# Patient Record
Sex: Female | Born: 1990 | Race: White | Hispanic: No | Smoking: Former smoker
Health system: Southern US, Community
[De-identification: ages and names within clinical notes are randomized; demographics above are authoritative.]

## PROBLEM LIST (undated history)

## (undated) DIAGNOSIS — F32A Depression, unspecified: Secondary | ICD-10-CM

## (undated) DIAGNOSIS — R7303 Prediabetes: Secondary | ICD-10-CM

## (undated) DIAGNOSIS — H20829 Vogt-Koyanagi syndrome, unspecified eye: Secondary | ICD-10-CM

## (undated) DIAGNOSIS — F419 Anxiety disorder, unspecified: Secondary | ICD-10-CM

## (undated) DIAGNOSIS — B999 Unspecified infectious disease: Secondary | ICD-10-CM

## (undated) DIAGNOSIS — R87629 Unspecified abnormal cytological findings in specimens from vagina: Secondary | ICD-10-CM

## (undated) DIAGNOSIS — D649 Anemia, unspecified: Secondary | ICD-10-CM

## (undated) HISTORY — DX: Unspecified infectious disease: B99.9

## (undated) HISTORY — DX: Anxiety disorder, unspecified: F41.9

## (undated) HISTORY — DX: Anemia, unspecified: D64.9

## (undated) HISTORY — DX: Vogt-Koyanagi syndrome, unspecified eye: H20.829

## (undated) HISTORY — DX: Unspecified abnormal cytological findings in specimens from vagina: R87.629

---

## 2011-07-23 HISTORY — PX: LIPOMA EXCISION: SHX5283

## 2019-10-22 ENCOUNTER — Ambulatory Visit: Payer: BC Managed Care – PPO | Attending: Internal Medicine

## 2019-10-22 DIAGNOSIS — Z23 Encounter for immunization: Secondary | ICD-10-CM

## 2019-10-22 NOTE — Progress Notes (Signed)
   Covid-19 Vaccination Clinic  Name:  Eileen Dudley    MRN: [id number] DOB: 14-Aug-1990  10/22/2019  [name] was observed post Covid-19 immunization for 15 minutes without incident. She was provided with Vaccine Information Sheet and instruction to access the V-Safe system.   [name] was instructed to call 911 with any severe reactions post vaccine: Marland Kitchen Difficulty breathing  . Swelling of face and throat  . A fast heartbeat  . A bad rash all over body  . Dizziness and weakness   Immunizations Administered    Name Date Dose VIS Date Route   Pfizer COVID-19 Vaccine 10/22/2019  9:49 AM 0.3 mL 07/02/2019 Intramuscular   Manufacturer: [employer], [employer]   Lot: [id number]   NDC: [address]-8

## 2019-11-15 ENCOUNTER — Ambulatory Visit: Payer: BC Managed Care – PPO | Attending: Internal Medicine

## 2019-11-15 DIAGNOSIS — Z23 Encounter for immunization: Secondary | ICD-10-CM

## 2019-11-15 NOTE — Progress Notes (Signed)
   Covid-19 Vaccination Clinic  Name:  Chele Cornell    MRN: [id number] DOB: 28-Jan-1991  11/15/2019  Ms. Socarras was observed post Covid-19 immunization for 15 minutes without incident. She was provided with Vaccine Information Sheet and instruction to access the V-Safe system.   Ms. Andon was instructed to call 911 with any severe reactions post vaccine: Marland Kitchen Difficulty breathing  . Swelling of face and throat  . A fast heartbeat  . A bad rash all over body  . Dizziness and weakness   Immunizations Administered    Name Date Dose VIS Date Route   Pfizer COVID-19 Vaccine 11/15/2019 12:08 PM 0.3 mL 09/15/2018 Intramuscular   Manufacturer: [employer], [employer]   Lot: [id number]   NDC: [address]-9

## 2021-01-16 ENCOUNTER — Other Ambulatory Visit: Payer: Self-pay

## 2021-01-16 ENCOUNTER — Encounter: Payer: Self-pay | Admitting: Emergency Medicine

## 2021-01-16 ENCOUNTER — Ambulatory Visit
Admission: EM | Admit: 2021-01-16 | Discharge: 2021-01-16 | Disposition: A | Discharge status: Home / Self Care | Payer: BC Managed Care – PPO | Attending: Sports Medicine | Admitting: Sports Medicine

## 2021-01-16 DIAGNOSIS — N898 Other specified noninflammatory disorders of vagina: Secondary | ICD-10-CM | POA: Diagnosis not present

## 2021-01-16 DIAGNOSIS — B373 Candidiasis of vulva and vagina: Secondary | ICD-10-CM | POA: Diagnosis present

## 2021-01-16 DIAGNOSIS — Z711 Person with feared health complaint in whom no diagnosis is made: Secondary | ICD-10-CM | POA: Insufficient documentation

## 2021-01-16 DIAGNOSIS — B3731 Acute candidiasis of vulva and vagina: Secondary | ICD-10-CM

## 2021-01-16 DIAGNOSIS — Z113 Encounter for screening for infections with a predominantly sexual mode of transmission: Secondary | ICD-10-CM

## 2021-01-16 MED ORDER — FLUCONAZOLE 150 MG PO TABS: 150.0000 mg | ORAL_TABLET | Freq: Every day | ORAL | 0 refills | Status: DC

## 2021-01-16 NOTE — Discharge Instructions (Addendum)
As we discussed, I sent off a vaginal swab for yeast, bacterial vaginosis, chlamydia, and gonorrhea.  Someone will contact you if you need more than the Diflucan that I sent to your pharmacy to cover the yeast infection. Please see educational handouts. If symptoms persist please see your primary care provider. If symptoms worsen or you develop any fever then this could be a sign of an ascending infection which would require you to go to the ER.

## 2021-01-16 NOTE — ED Triage Notes (Signed)
Pt presents today with c/o of "I have a yeast infection". She reports vaginal itching, swelling with white discharge x 4 days.

## 2021-01-16 NOTE — ED Provider Notes (Signed)
[facility]    CSN: [id number] Arrival date & time: 01/16/21  1743      History   Chief Complaint Chief Complaint  Patient presents with   Vaginal Itching   Vaginal Discharge    HPI Eileen Dudley is a 30 y.o. female.   30 year old female who presents for evaluation of the above issue.  Her primary care physician is through [place].  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  She reports that she thinks she has a yeast infection.  She has had vaginal itching and a whitish discharge with a little bit of swelling of her labia for about 4 days now.  She is sexually active.  She recently had a sexual encounter with a new partner.  She is concerned that she may have an STD and wants to be tested for that as well.  She denies any dysuria or hematuria.  No increased urinary frequency or urgency.  No recent antibiotic use.  No real lower abdominal pain.  No flank pain.  No fever shakes chills.  No nausea vomiting or diarrhea.  No red flag signs or symptoms elicited on history.   History reviewed. No pertinent past medical history.  There are no problems to display for this patient.   History reviewed. No pertinent surgical history.  OB History   No obstetric history on file.      Home Medications    Prior to Admission medications   Medication Sig Start Date End Date Taking? Authorizing Provider  fluconazole (DIFLUCAN) 150 MG tablet Take 1 tablet (150 mg total) by mouth daily. Take 1 tablet by mouth today.  Can repeat in 1 week if still symptomatic. 01/16/21  Yes Delton See, MD    Family History Family History  Problem Relation Age of Onset   Pulmonary fibrosis Father     Social History Social History   Tobacco Use   Smoking status: Some Days    Pack years: 0.00    Types: Cigarettes   Smokeless tobacco: Never  Vaping Use   Vaping Use: Never used  Substance Use Topics   Alcohol use: Yes    Alcohol/week: 8.0 standard drinks    Types:  8 Cans of beer per week    Comment: weekly   Drug use: Never     Allergies   Patient has no known allergies.   Review of Systems Review of Systems  Constitutional:  Negative for activity change, appetite change, chills, diaphoresis, fatigue and fever.  HENT:  Negative for congestion, ear pain, postnasal drip, rhinorrhea, sinus pressure, sinus pain, sneezing and sore throat.   Eyes:  Negative for pain.  Respiratory:  Negative for cough, chest tightness and shortness of breath.   Cardiovascular:  Negative for chest pain and palpitations.  Gastrointestinal:  Negative for abdominal pain, diarrhea, nausea and vomiting.  Genitourinary:  Positive for vaginal discharge. Negative for dysuria, flank pain, frequency, genital sores, hematuria, urgency, vaginal bleeding and vaginal pain.       Positive for vaginal itching and swelling of the labia without pain.  Musculoskeletal:  Negative for back pain, myalgias and neck pain.  Skin:  Negative for color change, pallor, rash and wound.  Neurological:  Negative for dizziness, light-headedness and headaches.  All other systems reviewed and are negative.   Physical Exam Triage Vital Signs ED Triage Vitals  Enc Vitals Group     BP 01/16/21 1802 124/82     Pulse Rate 01/16/21 1802 86  Resp 01/16/21 1802 16     Temp 01/16/21 1802 98.2 F (36.8 C)     Temp Source 01/16/21 1802 Oral     SpO2 01/16/21 1802 99 %     Weight --      Height --      Head Circumference --      Peak Flow --      Pain Score 01/16/21 1756 3     Pain Loc --      Pain Edu? --      Excl. in GC? --    No data found.  Updated Vital Signs BP 124/82 (BP Location: Right Arm)   Pulse 86   Temp 98.2 F (36.8 C) (Oral)   Resp 16   LMP 01/03/2021 (Approximate)   SpO2 99%   Visual Acuity Right Eye Distance:   Left Eye Distance:   Bilateral Distance:    Right Eye Near:   Left Eye Near:    Bilateral Near:     Physical Exam Vitals and nursing note reviewed.   Constitutional:      General: She is not in acute distress.    Appearance: Normal appearance. She is not ill-appearing, toxic-appearing or diaphoretic.  HENT:     Head: Normocephalic and atraumatic.     Nose: Nose normal.     Mouth/Throat:     Mouth: Mucous membranes are moist.  Eyes:     General: No scleral icterus.    Conjunctiva/sclera: Conjunctivae normal.     Pupils: Pupils are equal, round, and reactive to light.  Cardiovascular:     Rate and Rhythm: Normal rate and regular rhythm.     Pulses: Normal pulses.     Heart sounds: Normal heart sounds. No murmur heard.   No friction rub. No gallop.  Pulmonary:     Effort: Pulmonary effort is normal.     Breath sounds: Normal breath sounds. No stridor. No wheezing, rhonchi or rales.  Abdominal:     General: Abdomen is flat.     Tenderness: There is no abdominal tenderness. There is no right CVA tenderness, left CVA tenderness, guarding or rebound.  Musculoskeletal:     Cervical back: Normal range of motion and neck supple.  Skin:    General: Skin is warm and dry.     Capillary Refill: Capillary refill takes less than 2 seconds.     Coloration: Skin is not jaundiced.     Findings: No erythema or rash.  Neurological:     General: No focal deficit present.     Mental Status: She is alert and oriented to person, place, and time.     UC Treatments / Results  Labs (all labs ordered are listed, but only abnormal results are displayed) Labs Reviewed  CERVICOVAGINAL ANCILLARY ONLY    EKG   Radiology No results found.  Procedures Procedures (including critical care time)  Medications Ordered in UC Medications - No data to display  Initial Impression / Assessment and Plan / UC Course  I have reviewed the triage vital signs and the nursing notes.  Pertinent labs & imaging results that were available during my care of the patient were reviewed by me and considered in my medical decision making (see chart for  details).  Clinical impression: Vaginal discharge with vaginal itching for about 4 days.  Concerning for potential yeast vaginitis versus another STD including GC, chlamydia, or bacterial vaginosis.  Treatment plan: 1.  The findings and treatment plan were discussed in detail  with the patient.  Patient was in agreement. 2.  Micah Flesher ahead and sent off a vaginal swab for yeast, BV, chlamydia and gonorrhea. 3.  Given her symptoms we will go ahead and treat her with Diflucan.  If she is in need of antibiotics for chlamydia or gonorrhea or metronidazole for BV someone will contact her based on her lab results. 4.  Encouraged her to download the app MyChart on her phone and follow along. 5.  Plenty of rest, plenty fluids, Tylenol or Motrin for any discomfort. 6.  Also gave her some educational handouts which included information on probiotics which I encouraged her to take. 7.  Encouraged her to seek out the care in the ER if she developed any flank pain or fever as this could be a sign of an a sending infection. 8.  If symptoms persist she should see her PCP. 9.  If symptoms were to worsen she should go to the ER. 10.  She was stable upon discharge and will follow-up here as needed.    Final Clinical Impressions(s) / UC Diagnoses   Final diagnoses:  Vaginal discharge  Yeast vaginitis  Concern about STD in female without diagnosis  Vaginal itching     Discharge Instructions      As we discussed, I sent off a vaginal swab for yeast, bacterial vaginosis, chlamydia, and gonorrhea.  Someone will contact you if you need more than the Diflucan that I sent to your pharmacy to cover the yeast infection. Please see educational handouts. If symptoms persist please see your primary care provider. If symptoms worsen or you develop any fever then this could be a sign of an ascending infection which would require you to go to the ER.     ED Prescriptions     Medication Sig Dispense Auth. Provider    fluconazole (DIFLUCAN) 150 MG tablet Take 1 tablet (150 mg total) by mouth daily. Take 1 tablet by mouth today.  Can repeat in 1 week if still symptomatic. 2 tablet Delton See, MD      PDMP not reviewed this encounter.   Delton See, MD 01/16/21 [phone]

## 2021-01-17 LAB — CERVICOVAGINAL ANCILLARY ONLY
Bacterial Vaginitis (gardnerella): POSITIVE — AB
Candida Glabrata: NEGATIVE
Candida Vaginitis: POSITIVE — AB
Chlamydia: NEGATIVE
Comment: NEGATIVE
Comment: NEGATIVE
Comment: NEGATIVE
Comment: NEGATIVE
Comment: NEGATIVE
Comment: NORMAL
Neisseria Gonorrhea: NEGATIVE
Trichomonas: NEGATIVE

## 2021-01-18 ENCOUNTER — Telehealth (HOSPITAL_COMMUNITY): Payer: Self-pay | Admitting: Emergency Medicine

## 2021-01-18 MED ORDER — METRONIDAZOLE 500 MG PO TABS: 500.0000 mg | ORAL_TABLET | Freq: Two times a day (BID) | ORAL | 0 refills | Status: DC

## 2021-01-30 ENCOUNTER — Inpatient Hospital Stay
Admission: EM | Admit: 2021-01-30 | Discharge: 2021-02-02 | DRG: 872 | Disposition: A | Discharge status: Home / Self Care | Payer: BC Managed Care – PPO | Source: Ambulatory Visit | Attending: Internal Medicine | Admitting: Internal Medicine

## 2021-01-30 ENCOUNTER — Encounter: Payer: Self-pay | Admitting: Emergency Medicine

## 2021-01-30 ENCOUNTER — Ambulatory Visit (INDEPENDENT_AMBULATORY_CARE_PROVIDER_SITE_OTHER)
Admission: EM | Admit: 2021-01-30 | Discharge: 2021-01-30 | Disposition: A | Discharge status: Home / Self Care | Payer: BC Managed Care – PPO | Source: Home / Self Care | Attending: Emergency Medicine | Admitting: Emergency Medicine

## 2021-01-30 ENCOUNTER — Emergency Department: Payer: BC Managed Care – PPO

## 2021-01-30 ENCOUNTER — Other Ambulatory Visit: Payer: Self-pay

## 2021-01-30 DIAGNOSIS — R6883 Chills (without fever): Secondary | ICD-10-CM | POA: Insufficient documentation

## 2021-01-30 DIAGNOSIS — M545 Low back pain, unspecified: Secondary | ICD-10-CM | POA: Insufficient documentation

## 2021-01-30 DIAGNOSIS — A419 Sepsis, unspecified organism: Secondary | ICD-10-CM | POA: Diagnosis not present

## 2021-01-30 DIAGNOSIS — N12 Tubulo-interstitial nephritis, not specified as acute or chronic: Secondary | ICD-10-CM | POA: Diagnosis present

## 2021-01-30 DIAGNOSIS — R000 Tachycardia, unspecified: Secondary | ICD-10-CM

## 2021-01-30 DIAGNOSIS — F17210 Nicotine dependence, cigarettes, uncomplicated: Secondary | ICD-10-CM | POA: Diagnosis present

## 2021-01-30 DIAGNOSIS — D689 Coagulation defect, unspecified: Secondary | ICD-10-CM | POA: Diagnosis present

## 2021-01-30 DIAGNOSIS — I959 Hypotension, unspecified: Secondary | ICD-10-CM | POA: Insufficient documentation

## 2021-01-30 DIAGNOSIS — E876 Hypokalemia: Secondary | ICD-10-CM | POA: Diagnosis present

## 2021-01-30 DIAGNOSIS — D649 Anemia, unspecified: Secondary | ICD-10-CM | POA: Diagnosis present

## 2021-01-30 DIAGNOSIS — R6520 Severe sepsis without septic shock: Secondary | ICD-10-CM | POA: Diagnosis not present

## 2021-01-30 DIAGNOSIS — N179 Acute kidney failure, unspecified: Secondary | ICD-10-CM | POA: Diagnosis present

## 2021-01-30 DIAGNOSIS — R3915 Urgency of urination: Secondary | ICD-10-CM | POA: Insufficient documentation

## 2021-01-30 DIAGNOSIS — Z3202 Encounter for pregnancy test, result negative: Secondary | ICD-10-CM | POA: Diagnosis present

## 2021-01-30 DIAGNOSIS — N200 Calculus of kidney: Secondary | ICD-10-CM | POA: Diagnosis present

## 2021-01-30 DIAGNOSIS — Z20822 Contact with and (suspected) exposure to covid-19: Secondary | ICD-10-CM | POA: Insufficient documentation

## 2021-01-30 DIAGNOSIS — R7303 Prediabetes: Secondary | ICD-10-CM | POA: Diagnosis present

## 2021-01-30 DIAGNOSIS — R079 Chest pain, unspecified: Secondary | ICD-10-CM | POA: Insufficient documentation

## 2021-01-30 DIAGNOSIS — F32A Depression, unspecified: Secondary | ICD-10-CM | POA: Diagnosis present

## 2021-01-30 DIAGNOSIS — R55 Syncope and collapse: Secondary | ICD-10-CM | POA: Insufficient documentation

## 2021-01-30 DIAGNOSIS — Z87440 Personal history of urinary (tract) infections: Secondary | ICD-10-CM

## 2021-01-30 HISTORY — DX: Tubulo-interstitial nephritis, not specified as acute or chronic: N12

## 2021-01-30 HISTORY — DX: Prediabetes: R73.03

## 2021-01-30 HISTORY — DX: Sepsis, unspecified organism: A41.9

## 2021-01-30 HISTORY — DX: Depression, unspecified: F32.A

## 2021-01-30 LAB — URINALYSIS, COMPLETE (UACMP) WITH MICROSCOPIC
Bilirubin Urine: NEGATIVE
Glucose, UA: NEGATIVE mg/dL
Ketones, ur: NEGATIVE mg/dL
Nitrite: NEGATIVE
Protein, ur: 30 mg/dL — AB
Specific Gravity, Urine: 1.01 (ref 1.005–1.030)
WBC, UA: >50 WBC/hpf — ABNORMAL HIGH (ref 0–5)
pH: 6 (ref 5.0–8.0)

## 2021-01-30 LAB — COMPREHENSIVE METABOLIC PANEL
ALT: 13 U/L (ref 0–44)
AST: 20 U/L (ref 15–41)
Albumin: 3 g/dL — ABNORMAL LOW (ref 3.5–5.0)
Alkaline Phosphatase: 57 U/L (ref 38–126)
Anion gap: 9 (ref 5–15)
BUN: 10 mg/dL (ref 6–20)
CO2: 25 mmol/L (ref 22–32)
Calcium: 7.8 mg/dL — ABNORMAL LOW (ref 8.9–10.3)
Chloride: 100 mmol/L (ref 98–111)
Creatinine, Ser: 1.21 mg/dL — ABNORMAL HIGH (ref 0.44–1.00)
GFR, Estimated: >60 mL/min (ref >60)
Glucose, Bld: 129 mg/dL — ABNORMAL HIGH (ref 70–99)
Potassium: 2.7 mmol/L — CL (ref 3.5–5.1)
Sodium: 134 mmol/L — ABNORMAL LOW (ref 135–145)
Total Bilirubin: 0.6 mg/dL (ref 0.3–1.2)
Total Protein: 7 g/dL (ref 6.5–8.1)

## 2021-01-30 LAB — CBC WITH DIFFERENTIAL/PLATELET
Abs Immature Granulocytes: 1.54 10*3/uL — ABNORMAL HIGH (ref 0.00–0.07)
Basophils Absolute: 0.1 10*3/uL (ref 0.0–0.1)
Basophils Relative: 0 %
Eosinophils Absolute: 0.1 10*3/uL (ref 0.0–0.5)
Eosinophils Relative: 0 %
HCT: 34.9 % — ABNORMAL LOW (ref 36.0–46.0)
Hemoglobin: 11.5 g/dL — ABNORMAL LOW (ref 12.0–15.0)
Immature Granulocytes: 5 %
Lymphocytes Relative: 3 %
Lymphs Abs: 0.8 10*3/uL (ref 0.7–4.0)
MCH: 28.7 pg (ref 26.0–34.0)
MCHC: 33 g/dL (ref 30.0–36.0)
MCV: 87 fL (ref 80.0–100.0)
Monocytes Absolute: 1.6 10*3/uL — ABNORMAL HIGH (ref 0.1–1.0)
Monocytes Relative: 5 %
Neutro Abs: 26.8 10*3/uL — ABNORMAL HIGH (ref 1.7–7.7)
Neutrophils Relative %: 87 %
Platelets: 463 10*3/uL — ABNORMAL HIGH (ref 150–400)
RBC: 4.01 MIL/uL (ref 3.87–5.11)
RDW: 15.9 % — ABNORMAL HIGH (ref 11.5–15.5)
Smear Review: NORMAL
WBC: 31 10*3/uL — ABNORMAL HIGH (ref 4.0–10.5)
nRBC: 0 % (ref 0.0–0.2)

## 2021-01-30 LAB — LACTIC ACID, PLASMA: Lactic Acid, Venous: 1.4 mmol/L (ref 0.5–1.9)

## 2021-01-30 LAB — RESP PANEL BY RT-PCR (FLU A&B, COVID) ARPGX2
Influenza A by PCR: NEGATIVE
Influenza B by PCR: NEGATIVE
SARS Coronavirus 2 by RT PCR: NEGATIVE

## 2021-01-30 LAB — MAGNESIUM: Magnesium: 1.6 mg/dL — ABNORMAL LOW (ref 1.7–2.4)

## 2021-01-30 LAB — APTT: aPTT: 35 seconds (ref 24–36)

## 2021-01-30 LAB — PROTIME-INR
INR: 1.3 — ABNORMAL HIGH (ref 0.8–1.2)
Prothrombin Time: 16.6 seconds — ABNORMAL HIGH (ref 11.4–15.2)

## 2021-01-30 MED ORDER — ONDANSETRON HCL 4 MG/2ML IJ SOLN: 4.0000 mg | Freq: Four times a day (QID) | INTRAMUSCULAR | Status: DC | PRN

## 2021-01-30 MED ORDER — POTASSIUM CHLORIDE CRYS ER 20 MEQ PO TBCR
40.0000 meq | EXTENDED_RELEASE_TABLET | Freq: Once | ORAL | Status: AC
  Administered 2021-01-30: 40 meq via ORAL
  Filled 2021-01-30: qty 2

## 2021-01-30 MED ORDER — SODIUM CHLORIDE 0.9 % IV SOLN
1.0000 g | INTRAVENOUS | Status: DC
  Administered 2021-01-31: 01:00:00 1 g via INTRAVENOUS
  Filled 2021-01-30 (×2): qty 10
  Filled 2021-01-30: qty 1

## 2021-01-30 MED ORDER — SODIUM CHLORIDE 0.9 % IV BOLUS
1000.0000 mL | Freq: Once | INTRAVENOUS | Status: AC
  Administered 2021-01-30: 1000 mL via INTRAVENOUS

## 2021-01-30 MED ORDER — POTASSIUM CHLORIDE 10 MEQ/100ML IV SOLN
10.0000 meq | INTRAVENOUS | Status: AC
  Administered 2021-01-30 (×3): 10 meq via INTRAVENOUS
  Filled 2021-01-30 (×3): qty 100

## 2021-01-30 MED ORDER — LACTATED RINGERS IV BOLUS (SEPSIS)
1000.0000 mL | Freq: Once | INTRAVENOUS | Status: AC
  Administered 2021-01-30: 1000 mL via INTRAVENOUS

## 2021-01-30 MED ORDER — ONDANSETRON HCL 4 MG/2ML IJ SOLN
4.0000 mg | Freq: Once | INTRAMUSCULAR | Status: DC
  Filled 2021-01-30: qty 2

## 2021-01-30 MED ORDER — ACETAMINOPHEN 325 MG PO TABS
650.0000 mg | ORAL_TABLET | Freq: Four times a day (QID) | ORAL | Status: DC | PRN
  Administered 2021-01-30 – 2021-02-02 (×4): 650 mg via ORAL
  Filled 2021-01-30 (×4): qty 2

## 2021-01-30 MED ORDER — ONDANSETRON HCL 4 MG PO TABS
4.0000 mg | ORAL_TABLET | Freq: Four times a day (QID) | ORAL | Status: DC | PRN
  Administered 2021-02-01: 4 mg via ORAL
  Filled 2021-01-30: qty 1

## 2021-01-30 MED ORDER — MAGNESIUM SULFATE 2 GM/50ML IV SOLN
2.0000 g | Freq: Once | INTRAVENOUS | Status: AC
  Administered 2021-01-30: 2 g via INTRAVENOUS
  Filled 2021-01-30: qty 50

## 2021-01-30 MED ORDER — METRONIDAZOLE 500 MG/100ML IV SOLN
500.0000 mg | Freq: Once | INTRAVENOUS | Status: AC
  Administered 2021-01-30: 500 mg via INTRAVENOUS
  Filled 2021-01-30: qty 100

## 2021-01-30 MED ORDER — ENOXAPARIN SODIUM 40 MG/0.4ML IJ SOSY
40.0000 mg | PREFILLED_SYRINGE | INTRAMUSCULAR | Status: DC
  Administered 2021-01-30 – 2021-02-01 (×3): 40 mg via SUBCUTANEOUS
  Filled 2021-01-30 (×3): qty 0.4

## 2021-01-30 MED ORDER — POTASSIUM CHLORIDE IN NACL 20-0.9 MEQ/L-% IV SOLN
INTRAVENOUS | Status: DC
  Filled 2021-01-30: qty 1000

## 2021-01-30 MED ORDER — ACETAMINOPHEN 650 MG RE SUPP: 650.0000 mg | Freq: Four times a day (QID) | RECTAL | Status: DC | PRN

## 2021-01-30 MED ORDER — VANCOMYCIN HCL IN DEXTROSE 1-5 GM/200ML-% IV SOLN
1000.0000 mg | Freq: Once | INTRAVENOUS | Status: AC
  Administered 2021-01-30: 1000 mg via INTRAVENOUS
  Filled 2021-01-30: qty 200

## 2021-01-30 MED ORDER — SODIUM CHLORIDE 0.9% FLUSH
3.0000 mL | Freq: Two times a day (BID) | INTRAVENOUS | Status: DC
  Administered 2021-01-30 – 2021-02-02 (×5): 3 mL via INTRAVENOUS

## 2021-01-30 MED ORDER — SODIUM CHLORIDE 0.9 % IV SOLN
2.0000 g | Freq: Once | INTRAVENOUS | Status: AC
  Administered 2021-01-30: 2 g via INTRAVENOUS
  Filled 2021-01-30: qty 2

## 2021-01-30 MED ORDER — LACTATED RINGERS IV BOLUS (SEPSIS)
1000.0000 mL | Freq: Once | INTRAVENOUS | Status: AC
Start: 2021-01-30 — End: 2021-01-30
  Administered 2021-01-30: 1000 mL via INTRAVENOUS

## 2021-01-30 MED ORDER — SODIUM CHLORIDE 0.9 % IV SOLN: Freq: Once | INTRAVENOUS | Status: AC

## 2021-01-30 MED ORDER — SODIUM CHLORIDE 0.9 % IV BOLUS: 1000.0000 mL | Freq: Once | INTRAVENOUS | Status: DC

## 2021-01-30 NOTE — Progress Notes (Signed)
PHARMACY -  BRIEF ANTIBIOTIC NOTE   Pharmacy has received consult(s) for vancomycin and cefepime from an ED provider.  The patient's profile has been reviewed for ht/wt/allergies/indication/available labs.    One time order(s) placed for vancomycin 1 g + cefepime 2 g  Further antibiotics/pharmacy consults should be ordered by admitting physician if indicated.                       Thank you,  Pricilla Riffle, PharmD 01/30/2021  4:46 PM

## 2021-01-30 NOTE — ED Provider Notes (Signed)
[facility] Emergency Department Provider Note  ____________________________________________   Event Date/Time   First MD Initiated Contact with Patient 01/30/21 1635     (approximate)  I have reviewed the triage vital signs and the nursing notes.   HISTORY  Chief Complaint Code Sepsis    HPI Eileen Dudley is a 30 y.o. female  here with severe chills, fever, back pain, urinary frequency. Seen on 6/28 last month at [facility] for BV/yeast infection, took flagyl and diflucan - vaginal sx cleared up, had mostly resolved. However, 2 days ago, started having sweats, chills, odor to urine, and urgency. Pt has since had loss of appetite, n/v, lightheadedness. Today, she went to UC and was found hypotensive, tachycardic, in distress. EMS called, started IV and brought here. Pt lied down at [facility] b/c she felt so dizzy and lightheaded. No current vaginal sx. No SOB, CP, cough. No known sick contacts. No flank pain. Reports some mild lower suprapubic and back pain. No alleviating factors.   History reviewed. No pertinent past medical history.  There are no problems to display for this patient.   History reviewed. No pertinent surgical history.  Prior to Admission medications   Medication Sig Start Date End Date Taking? Authorizing Provider  fluconazole (DIFLUCAN) 150 MG tablet Take 1 tablet (150 mg total) by mouth daily. Take 1 tablet by mouth today.  Can repeat in 1 week if still symptomatic. 01/16/21   Delton See, MD  metroNIDAZOLE (FLAGYL) 500 MG tablet Take 1 tablet (500 mg total) by mouth 2 (two) times daily. 01/18/21   Lamptey, Britta Mccreedy, MD    Allergies Patient has no known allergies.  Family History  Problem Relation Age of Onset   Pulmonary fibrosis Father     Social History Social History   Tobacco Use   Smoking status: Some Days    Pack years: 0.00    Types: Cigarettes   Smokeless tobacco: Never  Vaping Use   Vaping Use: Never used  Substance  Use Topics   Alcohol use: Yes    Alcohol/week: 8.0 standard drinks    Types: 8 Cans of beer per week    Comment: weekly   Drug use: Never    Review of Systems  Review of Systems  Constitutional:  Positive for chills, fatigue and fever.  HENT:  Negative for congestion and sore throat.   Eyes:  Negative for visual disturbance.  Respiratory:  Negative for cough and shortness of breath.   Cardiovascular:  Negative for chest pain.  Gastrointestinal:  Positive for nausea and vomiting. Negative for abdominal pain and diarrhea.  Genitourinary:  Positive for dysuria and frequency. Negative for flank pain.  Musculoskeletal:  Negative for back pain and neck pain.  Skin:  Negative for rash and wound.  Neurological:  Positive for weakness.  All other systems reviewed and are negative.   ____________________________________________  PHYSICAL EXAM:      VITAL SIGNS: ED Triage Vitals  Enc Vitals Group     BP 01/30/21 1643 (!) 78/55     Pulse Rate 01/30/21 1643 (!) 124     Resp 01/30/21 1643 18     Temp 01/30/21 1643 98.7 F (37.1 C)     Temp Source 01/30/21 1643 Oral     SpO2 01/30/21 1643 100 %     Weight 01/30/21 1641 164 lb 12.8 oz (74.8 kg)     Height 01/30/21 1641 5\' 6"  (1.676 m)     Head Circumference --  Peak Flow --      Pain Score --      Pain Loc --      Pain Edu? --      Excl. in GC? --      Physical Exam Vitals and nursing note reviewed.  Constitutional:      General: She is not in acute distress.    Appearance: She is well-developed.  HENT:     Head: Normocephalic and atraumatic.     Mouth/Throat:     Mouth: Mucous membranes are dry.  Eyes:     Conjunctiva/sclera: Conjunctivae normal.  Cardiovascular:     Rate and Rhythm: Regular rhythm. Tachycardia present.     Heart sounds: Normal heart sounds. No murmur heard.   No friction rub.  Pulmonary:     Effort: Pulmonary effort is normal. No respiratory distress.     Breath sounds: Normal breath sounds. No  wheezing or rales.  Abdominal:     General: There is no distension.     Palpations: Abdomen is soft.     Tenderness: There is no abdominal tenderness (mild, suprapubic, no rebound or guarding).  Musculoskeletal:     Cervical back: Neck supple.  Skin:    General: Skin is warm.     Capillary Refill: Capillary refill takes less than 2 seconds.  Neurological:     Mental Status: She is alert and oriented to person, place, and time.     Motor: No abnormal muscle tone.      ____________________________________________   LABS (all labs ordered are listed, but only abnormal results are displayed)  Labs Reviewed  COMPREHENSIVE METABOLIC PANEL - Abnormal; Notable for the following components:      Result Value   Sodium 134 (*)    Potassium 2.7 (*)    Glucose, Bld 129 (*)    Creatinine, Ser 1.21 (*)    Calcium 7.8 (*)    Albumin 3.0 (*)    All other components within normal limits  CBC WITH DIFFERENTIAL/PLATELET - Abnormal; Notable for the following components:   WBC 31.0 (*)    Hemoglobin 11.5 (*)    HCT 34.9 (*)    RDW 15.9 (*)    Platelets 463 (*)    Neutro Abs 26.8 (*)    Monocytes Absolute 1.6 (*)    Abs Immature Granulocytes 1.54 (*)    All other components within normal limits  PROTIME-INR - Abnormal; Notable for the following components:   Prothrombin Time 16.6 (*)    INR 1.3 (*)    All other components within normal limits  CULTURE, BLOOD (ROUTINE X 2)  CULTURE, BLOOD (ROUTINE X 2)  URINE CULTURE  LACTIC ACID, PLASMA  APTT  URINALYSIS, COMPLETE (UACMP) WITH MICROSCOPIC  POC URINE PREG, ED    ____________________________________________  EKG: Sinus tachycardia, VR 122. PR 144, QRS 92, QTc 433. Borderline TWA. No acute st elevations or depressions. No ischemia or infarct. ________________________________________  RADIOLOGY All imaging, including plain films, CT scans, and ultrasounds, independently reviewed by me, and interpretations confirmed via formal  radiology reads.  ED MD interpretation:   CT stone: Edematous left kidney with adjacent inflammatory stranding suggestive of pyelonephritis, no obstruction Chest x-ray: Clear  Official radiology report(s): DG Chest Port 1 View  Result Date: 01/30/2021 CLINICAL DATA:  Questionable sepsis EXAM: PORTABLE CHEST 1 VIEW COMPARISON:  None. FINDINGS: The heart size and mediastinal contours are within normal limits. Both lungs are clear. The visualized skeletal structures are unremarkable. IMPRESSION: No active disease.  Electronically Signed   By: Maudry Mayhew MD   On: 01/30/2021 17:50   CT Renal Stone Study  Result Date: 01/30/2021 CLINICAL DATA:  History of UTI completed antibiotics, now with back pain, fever chills and flu like symptoms. EXAM: CT ABDOMEN AND PELVIS WITHOUT CONTRAST TECHNIQUE: Multidetector CT imaging of the abdomen and pelvis was performed following the standard protocol without IV contrast. COMPARISON:  None. FINDINGS: Lower chest: No acute abnormality. Hepatobiliary: No focal liver abnormality is seen. No gallstones, gallbladder wall thickening, or biliary dilatation. Pancreas: Unremarkable. No pancreatic ductal dilatation or surrounding inflammatory changes. Spleen: Normal in size without focal abnormality. Adrenals/Urinary Tract: Bilateral adrenal glands are unremarkable. No hydronephrosis. Punctate nonobstructive 1-2 mm right lower pole renal stone. Edematous appearance of the left kidney with adjacent inflammatory stranding. No obstructive ureteral or bladder calculi visualized. Urinary bladder is unremarkable for degree of distension. Stomach/Bowel: Stomach is within normal limits. Appendix is not definitely visualized but there is no pericecal inflammation. No evidence of bowel wall thickening, distention, or inflammatory changes. Vascular/Lymphatic: No significant vascular findings are present. No enlarged abdominal or pelvic lymph nodes. Reproductive: Uterus and bilateral adnexa  are unremarkable. Other: No pneumoperitoneum or ascites. Musculoskeletal: No acute or significant osseous findings. IMPRESSION: 1. Edematous appearance of the left kidney with adjacent inflammatory stranding, suggestive of pyelonephritis. No hydronephrosis. 2. No obstructive ureteral or bladder calculi visualized. 3. Punctate nonobstructive right renal stone. Electronically Signed   By: Maudry Mayhew MD   On: 01/30/2021 17:59    ____________________________________________  PROCEDURES   Procedure(s) performed (including Critical Care):  Procedures  ____________________________________________  INITIAL IMPRESSION / MDM / ASSESSMENT AND PLAN / ED COURSE  As part of my medical decision making, I reviewed the following data within the electronic MEDICAL RECORD NUMBER Nursing notes reviewed and incorporated, Old chart reviewed, Notes from prior ED visits, and [place] Controlled Substance Database       *Shanikka Wonders was evaluated in Emergency Department on 01/30/2021 for the symptoms described in the history of present illness. She was evaluated in the context of the global COVID-19 pandemic, which necessitated consideration that the patient might be at risk for infection with the SARS-CoV-2 virus that causes COVID-19. Institutional protocols and algorithms that pertain to the evaluation of patients at risk for COVID-19 are in a state of rapid change based on information released by regulatory bodies including the CDC and federal and state organizations. These policies and algorithms were followed during the patient's care in the ED.  Some ED evaluations and interventions may be delayed as a result of limited staffing during the pandemic.*     Medical Decision Making: 30 year old female here with urinary frequency, dysuria, fever, nausea, and chills.  On arrival, patient is tachycardic, hypotensive, and tachypneic.  Vital signs are concerning for severe sepsis, likely secondary to UTI with  pyelonephritis.  Patient started on broad-spectrum empiric antibiotics with 30 cc/kg fluid bolus per protocol.  Lab work shows marked leukocytosis with left shift with white count of 31,000.  CMP shows likely dehydration with creatinine of 1.2 and hypokalemia to 2.7.  This is been replaced.  Lactic acid is normal.  CT stone study obtained given the degree of her sepsis with more unremarkable exam, and is consistent with [place].  No evidence of stone.  Chest x-ray reviewed and is clear.  EKG shows sinus tachycardia.  Will plan to admit for sepsis due to pyelonephritis.  Heart rate, blood pressure is improving significantly with fluids and patient nontoxic, mentating  well on exam.  ____________________________________________  FINAL CLINICAL IMPRESSION(S) / ED DIAGNOSES  Final diagnoses:  Sepsis without acute organ dysfunction, due to unspecified organism [facility])  Pyelonephritis     MEDICATIONS GIVEN DURING THIS VISIT:  Medications  lactated ringers bolus 1,000 mL (1,000 mLs Intravenous New Bag/Given 01/30/21 1652)    And  lactated ringers bolus 1,000 mL (has no administration in time range)    And  lactated ringers bolus 1,000 mL (has no administration in time range)  metroNIDAZOLE (FLAGYL) IVPB 500 mg (500 mg Intravenous New Bag/Given 01/30/21 1749)  vancomycin (VANCOCIN) IVPB 1000 mg/200 mL premix (1,000 mg Intravenous New Bag/Given 01/30/21 1746)  potassium chloride SA (KLOR-CON) CR tablet 40 mEq (has no administration in time range)  potassium chloride 10 mEq in 100 mL IVPB (has no administration in time range)  ondansetron (ZOFRAN) injection 4 mg (has no administration in time range)  ceFEPIme (MAXIPIME) 2 g in sodium chloride 0.9 % 100 mL IVPB (0 g Intravenous Stopped 01/30/21 1740)     ED Discharge Orders     None        Note:  This document was prepared using Dragon voice recognition software and may include unintentional dictation errors.   Shaune Pollack, MD 01/30/21  [phone]

## 2021-01-30 NOTE — Plan of Care (Signed)

## 2021-01-30 NOTE — ED Provider Notes (Signed)
HPI  SUBJECTIVE:  Eileen Dudley is a 30 y.o. female who presents with body aches, feeling faint, low back pain for the past 2 days.  She reports urinary urgency, frequency, cloudy and odorous urine.  She had an episode of chest pain earlier that she cannot describe, but it has resolved.  She states that she has been having body aches for the past few weeks.  She was seen here on 6/24 with vaginal irritation and itching, having body aches at that time as well.  She was found to have BV and yeast, was treated with Flagyl and Diflucan.  She finished these 2 days ago and the body aches returned.  Low back pain and urinary symptoms are new.  She reports sweats, chills, feeling hot, and does not have a thermometer at home.  No headache, nasal congestion, rhinorrhea, sore throat, loss of sense of smell or taste, nausea, vomiting, diarrhea, abdominal pain, pelvic pain, vaginal bleeding.  No coughing, wheezing, shortness of breath.  No dysuria, hematuria.  No known exposure to COVID or flu.  She got the second dose of the COVID-vaccine.  Did not get the flu vaccine.  No recent tick bite.  No rash.  She has been sleeping and trying to push fluids without improvement in her symptoms.  No aggravating or alleviating factors.  She has a past medical history of frequent UTIs, no history of pyelonephritis.  No history of COVID, diabetes, hypertension.  LMP: 1 and half weeks ago.  She is not sure if she could be pregnant.  PMD: In [place].   History reviewed. No pertinent past medical history.  History reviewed. No pertinent surgical history.  Family History  Problem Relation Age of Onset   Pulmonary fibrosis Father     Social History   Tobacco Use   Smoking status: Some Days    Pack years: 0.00    Types: Cigarettes   Smokeless tobacco: Never  Vaping Use   Vaping Use: Never used  Substance Use Topics   Alcohol use: Yes    Alcohol/week: 8.0 standard drinks    Types: 8 Cans of beer per week    Comment:  weekly   Drug use: Never    No current facility-administered medications for this encounter.  Current Outpatient Medications:    fluconazole (DIFLUCAN) 150 MG tablet, Take 1 tablet (150 mg total) by mouth daily. Take 1 tablet by mouth today.  Can repeat in 1 week if still symptomatic., Disp: 2 tablet, Rfl: 0   metroNIDAZOLE (FLAGYL) 500 MG tablet, Take 1 tablet (500 mg total) by mouth 2 (two) times daily., Disp: 14 tablet, Rfl: 0  No Known Allergies   ROS  As noted in HPI.   Physical Exam  BP (!) 88/66   Pulse (!) 120   Temp 99.3 F (37.4 C) (Oral)   Resp 18   Ht 5\' 6"  (1.676 m)   Wt 81.6 kg   LMP 01/03/2021 (Approximate)   SpO2 100%   BMI 29.05 kg/m   Vitals with BMI 01/30/2021 01/30/2021 01/30/2021  Height - - 5\' 6"   Weight - - 180 lbs  BMI - - 29.07  Systolic 88 68 -  Diastolic 66 57 -  Pulse 120 133 -     Constitutional: Well developed, well nourished, appears ill.  Pale. Eyes:  EOMI, conjunctiva normal bilaterally HENT: Normocephalic, atraumatic,mucus membranes moist Respiratory: Normal inspiratory effort, lungs clear bilaterally Cardiovascular: Regular tachycardia, no murmurs rubs or gallops GI: nondistended soft, nontender, no guarding,  rebound. Back: Bilateral CVAT skin: No rash, skin intact Musculoskeletal: no deformities Neurologic: Alert & oriented x 3, no focal neuro deficits Psychiatric: Speech and behavior appropriate   ED Course   Medications  sodium chloride 0.9 % bolus 1,000 mL (1,000 mLs Intravenous New Bag/Given 01/30/21 1530)    Orders Placed This Encounter  Procedures   Resp Panel by RT-PCR (Flu A&B, Covid) Nasopharyngeal Swab    Standing Status:   Standing    Number of Occurrences:   1    Order Specific Question:   Is this test for diagnosis or screening    Answer:   Diagnosis of ill patient    Order Specific Question:   Symptomatic for COVID-19 as defined by CDC    Answer:   Yes    Order Specific Question:   Date of Symptom Onset     Answer:   01/28/2021    Order Specific Question:   Hospitalized for COVID-19    Answer:   No    Order Specific Question:   Admitted to ICU for COVID-19    Answer:   No    Order Specific Question:   Previously tested for COVID-19    Answer:   Yes    Order Specific Question:   Resident in a congregate (group) care setting    Answer:   No    Order Specific Question:   Employed in healthcare setting    Answer:   No    Order Specific Question:   Pregnant    Answer:   No    Order Specific Question:   Has patient completed COVID vaccination(s) (2 doses of Pfizer/Moderna 1 dose of [employer])    Answer:   Yes    Order Specific Question:   Has patient completed COVID Booster / 3rd dose    Answer:   No   Airborne and Contact precautions    Standing Status:   Standing    Number of Occurrences:   1    No results found for this or any previous visit (from the past 24 hour(s)). No results found.  ED Clinical Impression  1. Hypotension, unspecified hypotension type   2. Tachycardia      ED Assessment/Plan  EKG: Sinus tachycardia, rate 121.  Normal axis, normal intervals.  Atrial hypertrophy.  Mild ST depression in inferior leads.  No ST elevation.   Patient was seen immediately upon arrival.  She has an acute illness with systemic symptoms most notably tachycardia at 133, hypotension. Initial blood pressure 66/57.   concern for urosepsis.  In the differential is sepsis from PID, but think that this is less likely as she denies any pelvic pain.  COVID, flu in the differential but think this is less likely with urinary symptoms.  Normal bolus saline was started.  EMS called.  Transferring to the [facility] emergency department.  Gave report to fire department and EMS.  Gave report to ED charge nurse.  Meds ordered this encounter  Medications   sodium chloride 0.9 % bolus 1,000 mL     *This clinic note was created using Scientist, clinical (histocompatibility and immunogenetics). Therefore, there may be occasional  mistakes despite careful proofreading.  ?    Domenick Gong, MD 01/30/21 1554

## 2021-01-30 NOTE — ED Notes (Signed)
Report received from Ashley, RN 

## 2021-01-30 NOTE — ED Notes (Signed)
Transported to ED via EMS 

## 2021-01-30 NOTE — Progress Notes (Signed)
CODE SEPSIS - PHARMACY COMMUNICATION  **Broad Spectrum Antibiotics should be administered within 1 hour of Sepsis diagnosis**  Time Code Sepsis Called/Page Received: 1645  Antibiotics Ordered: cefepime/Flagyl/vancomycin  Time of 1st antibiotic administration: 1655    Pricilla Riffle ,PharmD Clinical Pharmacist  01/30/2021  5:22 PM

## 2021-01-30 NOTE — ED Triage Notes (Signed)
Pt via EMS from [facility]. Pt was seen on the 28th at [facility] and was seen and treated for BV. Pt finished her antibiotics and started having back pain, chills, fever, and flu-like symptoms. Pt initial BP 60/40 and HR in 130s. Pt is A&Ox4 and NAD

## 2021-01-30 NOTE — ED Notes (Signed)
Admitting provider at bedside.

## 2021-01-30 NOTE — Progress Notes (Signed)
   01/30/21 2210  Assess: MEWS Score  Temp 99.3 F (37.4 C)  BP 102/65  Pulse Rate (!) 124  ECG Heart Rate (!) 124  Resp 20  Level of Consciousness Alert  SpO2 100 %  Assess: MEWS Score  MEWS Temp 0  MEWS Systolic 0  MEWS Pulse 2  MEWS RR 0  MEWS LOC 0  MEWS Score 2  MEWS Score Color Yellow  Assess: if the MEWS score is Yellow or Red  Were vital signs taken at a resting state? Yes  Focused Assessment No change from prior assessment  Does the patient meet 2 or more of the SIRS criteria? Yes  Does the patient have a confirmed or suspected source of infection? Yes  Provider and Rapid Response Notified? Yes (Patient MEWS 3 in ED)  MEWS guidelines implemented *See Row Information* Yes  Treat  MEWS Interventions Administered prn meds/treatments;Administered scheduled meds/treatments (tylenol given ED)  Pain Scale 0-10  Pain Score 0  Take Vital Signs  Increase Vital Sign Frequency  Yellow: Q 2hr X 2 then Q 4hr X 2, if remains yellow, continue Q 4hrs  Escalate  MEWS: Escalate Yellow: discuss with charge nurse/RN and consider discussing with provider and RRT  Notify: Charge Nurse/RN  Name of Charge Nurse/RN Notified Annice Pih  Date Charge Nurse/RN Notified 01/30/21  Time Charge Nurse/RN Notified 2230  Assess: SIRS CRITERIA  SIRS Temperature  0  SIRS Pulse 1  SIRS Respirations  0  SIRS WBC 1  SIRS Score Sum  2

## 2021-01-30 NOTE — ED Notes (Signed)
Ashley RN aware of assigned bed 

## 2021-01-30 NOTE — ED Notes (Signed)
Unable to get urine sample at this time.

## 2021-01-30 NOTE — ED Triage Notes (Signed)
Patient states that she has been having body aches all over her body, states that she feels like she has the flu.

## 2021-01-30 NOTE — ED Notes (Signed)
Pt's IV pump alarming as LR bolus finished. Channel turned off.

## 2021-01-30 NOTE — ED Notes (Signed)
Patient is being discharged from the Urgent Care and sent to the Emergency Department via EMS . Per Dr. Chaney Malling, patient is in need of higher level of care due to possible sepsis. Patient is aware and verbalizes understanding of plan of care.  Vitals:   01/30/21 1535 01/30/21 1549  BP: (!) 88/66 (!) 88/68  Pulse: (!) 120 (!) 120  Resp: 18   Temp:    SpO2: 100%

## 2021-01-30 NOTE — H&P (Signed)
History and Physical    Eileen Dudley XBM:[id number] DOB: [birth year] DOA: 01/30/2021  PCP: Patient, No Pcp Per (Inactive)  Patient coming from: Home  I have personally briefly reviewed patient's old medical records in [facility]  Chief Complaint: Back pain, fevers, chills  HPI: Eileen Dudley is a 30 y.o. female with medical history significant for prediabetes, depression, recurrent UTI who presented to the ED for evaluation of back pain, fevers, chills.  Patient initially had symptoms of fevers, chills, and concern for vaginal yeast infection about 2 weeks ago.  On June 28 she was diagnosed and treated for bacterial vaginitis and Candida vaginitis with a course of Flagyl and Diflucan.  She said her symptoms resolved after starting Flagyl.  She says she completed her course of Flagyl about 2 days ago and since then has been having recurrent fevers, chills, diaphoresis, nausea without emesis.  She has been having intermittent left flank discomfort.  She felt as if she had the flu and went to the urgent care for further evaluation.  In urgent care she was noted to be hypotensive with BP 68/57 and tachycardic with heart rate 133.  She was sent to the ED for further evaluation due to concern for sepsis.  ED Course:  Initial vitals showed BP 70/55, MAP 63, pulse 129, RR 26, temp 98.7 F, SPO2 93% on room air.  Labs show WBC 31.0, hemoglobin 11.5, platelets 463,000, sodium 134, potassium 2.7, bicarb 25, BUN 10, creatinine 1.21, serum glucose 129, LFTs within normal limits, lactic acid 1.4.  SARS-CoV-2 PCR negative.  Influenza a and B PCR's are negative.  Blood cultures obtained and pending.  Urinalysis and urine culture ordered and pending collection.  Portable chest x-ray negative for focal consolidation, edema, or effusion.  CT renal stone study shows findings suggestive of left-sided pyelonephritis without hydronephrosis.  No obstructive ureteral or bladder calculi visualized.   Punctate nonobstructive right renal stone noted.  Patient was given 3 L LR, IV K 10 mEq x 3, oral K 40 mEq x1, IV vancomycin, cefepime, and Flagyl.  The hospitalist service was consulted to admit for further evaluation and management.  Review of Systems: All systems reviewed and are negative except as documented in history of present illness above.   Past Medical History:  Diagnosis Date   Depression    Prediabetes     History reviewed. No pertinent surgical history.  Social History:  reports that she has been smoking cigarettes. She has never used smokeless tobacco. She reports current alcohol use of about 8.0 standard drinks of alcohol per week. She reports that she does not use drugs.  No Known Allergies  Family History  Problem Relation Age of Onset   Pulmonary fibrosis Father      Prior to Admission medications   Medication Sig Start Date End Date Taking? Authorizing Provider  fluconazole (DIFLUCAN) 150 MG tablet Take 1 tablet (150 mg total) by mouth daily. Take 1 tablet by mouth today.  Can repeat in 1 week if still symptomatic. 01/16/21   Delton See, MD  metroNIDAZOLE (FLAGYL) 500 MG tablet Take 1 tablet (500 mg total) by mouth 2 (two) times daily. 01/18/21   Merrilee Jansky, MD    Physical Exam: Vitals:   01/30/21 1700 01/30/21 1745 01/30/21 1800 01/30/21 1830  BP: (!) 91/58 103/71 93/68 97/76   Pulse:   (!) 101 (!) 111  Resp: 11 11 (!) 23 19  Temp:      TempSrc:  SpO2:   98% 100%  Weight:      Height:       Constitutional: Resting supine in bed, NAD, calm, comfortable Eyes: PERRL, lids and conjunctivae normal ENMT: Mucous membranes are moist. Posterior pharynx clear of any exudate or lesions.Normal dentition.  Neck: normal, supple, no masses. Respiratory: clear to auscultation bilaterally, no wheezing, no crackles. Normal respiratory effort. No accessory muscle use.  Cardiovascular: Regular rate and rhythm, no murmurs / rubs / gallops. No extremity  edema. 2+ pedal pulses. Abdomen: Positive left CVA tenderness.  No abdominal tenderness, no masses palpated. No hepatosplenomegaly. Bowel sounds positive.  Musculoskeletal: no clubbing / cyanosis. No joint deformity upper and lower extremities. Good ROM, no contractures. Normal muscle tone.  Skin: no rashes, lesions, ulcers. No induration Neurologic: CN 2-12 grossly intact. Sensation intact. Strength 5/5 in all 4.  Psychiatric: Normal judgment and insight. Alert and oriented x 3. Normal mood.   Labs on Admission: I have personally reviewed following labs and imaging studies  CBC: Recent Labs  Lab 01/30/21 1637  WBC 31.0*  NEUTROABS 26.8*  HGB 11.5*  HCT 34.9*  MCV 87.0  PLT 463*   Basic Metabolic Panel: Recent Labs  Lab 01/30/21 1637  NA 134*  K 2.7*  CL 100  CO2 25  GLUCOSE 129*  BUN 10  CREATININE 1.21*  CALCIUM 7.8*  MG 1.6*   GFR: Estimated Creatinine Clearance: 69 mL/min (A) (by C-G formula based on SCr of 1.21 mg/dL (H)). Liver Function Tests: Recent Labs  Lab 01/30/21 1637  AST 20  ALT 13  ALKPHOS 57  BILITOT 0.6  PROT 7.0  ALBUMIN 3.0*   No results for input(s): LIPASE, AMYLASE in the last 168 hours. No results for input(s): AMMONIA in the last 168 hours. Coagulation Profile: Recent Labs  Lab 01/30/21 1637  INR 1.3*   Cardiac Enzymes: No results for input(s): CKTOTAL, CKMB, CKMBINDEX, TROPONINI in the last 168 hours. BNP (last 3 results) No results for input(s): PROBNP in the last 8760 hours. HbA1C: No results for input(s): HGBA1C in the last 72 hours. CBG: No results for input(s): GLUCAP in the last 168 hours. Lipid Profile: No results for input(s): CHOL, HDL, LDLCALC, TRIG, CHOLHDL, LDLDIRECT in the last 72 hours. Thyroid Function Tests: No results for input(s): TSH, T4TOTAL, FREET4, T3FREE, THYROIDAB in the last 72 hours. Anemia Panel: No results for input(s): VITAMINB12, FOLATE, FERRITIN, TIBC, IRON, RETICCTPCT in the last 72  hours. Urine analysis: No results found for: COLORURINE, APPEARANCEUR, LABSPEC, PHURINE, GLUCOSEU, HGBUR, BILIRUBINUR, KETONESUR, PROTEINUR, UROBILINOGEN, NITRITE, LEUKOCYTESUR  Radiological Exams on Admission: DG Chest Port 1 View  Result Date: 01/30/2021 CLINICAL DATA:  Questionable sepsis EXAM: PORTABLE CHEST 1 VIEW COMPARISON:  None. FINDINGS: The heart size and mediastinal contours are within normal limits. Both lungs are clear. The visualized skeletal structures are unremarkable. IMPRESSION: No active disease. Electronically Signed   By: Maudry Mayhew MD   On: 01/30/2021 17:50   CT Renal Stone Study  Result Date: 01/30/2021 CLINICAL DATA:  History of UTI completed antibiotics, now with back pain, fever chills and flu like symptoms. EXAM: CT ABDOMEN AND PELVIS WITHOUT CONTRAST TECHNIQUE: Multidetector CT imaging of the abdomen and pelvis was performed following the standard protocol without IV contrast. COMPARISON:  None. FINDINGS: Lower chest: No acute abnormality. Hepatobiliary: No focal liver abnormality is seen. No gallstones, gallbladder wall thickening, or biliary dilatation. Pancreas: Unremarkable. No pancreatic ductal dilatation or surrounding inflammatory changes. Spleen: Normal in size without focal abnormality. Adrenals/Urinary  Tract: Bilateral adrenal glands are unremarkable. No hydronephrosis. Punctate nonobstructive 1-2 mm right lower pole renal stone. Edematous appearance of the left kidney with adjacent inflammatory stranding. No obstructive ureteral or bladder calculi visualized. Urinary bladder is unremarkable for degree of distension. Stomach/Bowel: Stomach is within normal limits. Appendix is not definitely visualized but there is no pericecal inflammation. No evidence of bowel wall thickening, distention, or inflammatory changes. Vascular/Lymphatic: No significant vascular findings are present. No enlarged abdominal or pelvic lymph nodes. Reproductive: Uterus and bilateral  adnexa are unremarkable. Other: No pneumoperitoneum or ascites. Musculoskeletal: No acute or significant osseous findings. IMPRESSION: 1. Edematous appearance of the left kidney with adjacent inflammatory stranding, suggestive of pyelonephritis. No hydronephrosis. 2. No obstructive ureteral or bladder calculi visualized. 3. Punctate nonobstructive right renal stone. Electronically Signed   By: Maudry Mayhew MD   On: 01/30/2021 17:59    EKG: Personally reviewed. Sinus tachycardia, rate 122.  No prior for comparison.  Assessment/Plan Principal Problem:   Severe sepsis (HCC) Active Problems:   Pyelonephritis of left kidney   Hypokalemia   Eileen Dudley is a 30 y.o. female with medical history significant for prediabetes, depression, recurrent UTI who is admitted with severe sepsis due to left-sided pyelonephritis.  Severe sepsis due to left-sided pyelonephritis: Patient presenting with hypotension, MAP <65, tachycardia, tachypnea, and findings consistent with left-sided pyelonephritis. -Narrow antibiotics IV ceftriaxone -Awaiting urinalysis/urine culture collection -Follow blood cultures -Continue IV fluid hydration overnight  Hypokalemia: Receiving oral and IV supplementation.  Magnesium 1.6, will supplement.   DVT prophylaxis: Lovenox Code Status: Full code Family Communication: Discussed with patient, she has discussed with family Disposition Plan: From home and likely discharge to home pending clinical progress Consults called: None Level of care: Med-Surg Admission status:  Status is: Observation  The patient remains OBS appropriate and will d/c before 2 midnights.  Dispo: The patient is from: Home              Anticipated d/c is to: Home              Patient currently is not medically stable to d/c.   Difficult to place patient No   Darreld Mclean MD [facility]  If 7PM-7AM, please contact night-coverage [email or web address]  01/30/2021, 7:32 PM

## 2021-01-30 NOTE — Sepsis Progress Note (Signed)
Sepsis protocol followed by eLink 

## 2021-01-31 DIAGNOSIS — Z3202 Encounter for pregnancy test, result negative: Secondary | ICD-10-CM | POA: Diagnosis present

## 2021-01-31 DIAGNOSIS — E876 Hypokalemia: Secondary | ICD-10-CM | POA: Diagnosis present

## 2021-01-31 DIAGNOSIS — N179 Acute kidney failure, unspecified: Secondary | ICD-10-CM | POA: Diagnosis present

## 2021-01-31 DIAGNOSIS — N200 Calculus of kidney: Secondary | ICD-10-CM | POA: Diagnosis present

## 2021-01-31 DIAGNOSIS — D649 Anemia, unspecified: Secondary | ICD-10-CM | POA: Diagnosis present

## 2021-01-31 DIAGNOSIS — A419 Sepsis, unspecified organism: Principal | ICD-10-CM | POA: Diagnosis present

## 2021-01-31 DIAGNOSIS — R6520 Severe sepsis without septic shock: Secondary | ICD-10-CM | POA: Diagnosis present

## 2021-01-31 DIAGNOSIS — F32A Depression, unspecified: Secondary | ICD-10-CM | POA: Diagnosis present

## 2021-01-31 DIAGNOSIS — Z87440 Personal history of urinary (tract) infections: Secondary | ICD-10-CM | POA: Diagnosis not present

## 2021-01-31 DIAGNOSIS — R7303 Prediabetes: Secondary | ICD-10-CM | POA: Diagnosis present

## 2021-01-31 DIAGNOSIS — F17210 Nicotine dependence, cigarettes, uncomplicated: Secondary | ICD-10-CM | POA: Diagnosis present

## 2021-01-31 DIAGNOSIS — D689 Coagulation defect, unspecified: Secondary | ICD-10-CM | POA: Diagnosis present

## 2021-01-31 DIAGNOSIS — N12 Tubulo-interstitial nephritis, not specified as acute or chronic: Secondary | ICD-10-CM | POA: Diagnosis present

## 2021-01-31 DIAGNOSIS — Z20822 Contact with and (suspected) exposure to covid-19: Secondary | ICD-10-CM | POA: Diagnosis present

## 2021-01-31 LAB — CBC
HCT: 30 % — ABNORMAL LOW (ref 36.0–46.0)
Hemoglobin: 9.7 g/dL — ABNORMAL LOW (ref 12.0–15.0)
MCH: 28.4 pg (ref 26.0–34.0)
MCHC: 32.3 g/dL (ref 30.0–36.0)
MCV: 88 fL (ref 80.0–100.0)
Platelets: 338 10*3/uL (ref 150–400)
RBC: 3.41 MIL/uL — ABNORMAL LOW (ref 3.87–5.11)
RDW: 16.2 % — ABNORMAL HIGH (ref 11.5–15.5)
WBC: 22.8 10*3/uL — ABNORMAL HIGH (ref 4.0–10.5)
nRBC: 0 % (ref 0.0–0.2)

## 2021-01-31 LAB — HIV ANTIBODY (ROUTINE TESTING W REFLEX): HIV Screen 4th Generation wRfx: NONREACTIVE

## 2021-01-31 LAB — PREGNANCY, URINE: Preg Test, Ur: NEGATIVE

## 2021-01-31 LAB — LACTIC ACID, PLASMA
Lactic Acid, Venous: 0.9 mmol/L (ref 0.5–1.9)
Lactic Acid, Venous: 1.9 mmol/L (ref 0.5–1.9)

## 2021-01-31 LAB — BASIC METABOLIC PANEL
Anion gap: 6 (ref 5–15)
BUN: 8 mg/dL (ref 6–20)
CO2: 25 mmol/L (ref 22–32)
Calcium: 7.5 mg/dL — ABNORMAL LOW (ref 8.9–10.3)
Chloride: 106 mmol/L (ref 98–111)
Creatinine, Ser: 0.9 mg/dL (ref 0.44–1.00)
GFR, Estimated: >60 mL/min (ref >60)
Glucose, Bld: 124 mg/dL — ABNORMAL HIGH (ref 70–99)
Potassium: 4.4 mmol/L (ref 3.5–5.1)
Sodium: 137 mmol/L (ref 135–145)

## 2021-01-31 LAB — PROCALCITONIN: Procalcitonin: 4.75 ng/mL

## 2021-01-31 MED ORDER — LACTATED RINGERS IV SOLN: INTRAVENOUS | Status: DC

## 2021-01-31 MED ORDER — SODIUM CHLORIDE 0.9 % IV BOLUS: 500.0000 mL | Freq: Once | INTRAVENOUS | Status: DC

## 2021-01-31 MED ORDER — HYDROXYZINE HCL 25 MG PO TABS
25.0000 mg | ORAL_TABLET | Freq: Three times a day (TID) | ORAL | Status: DC | PRN
  Administered 2021-01-31: 22:00:00 25 mg via ORAL
  Filled 2021-01-31 (×2): qty 1

## 2021-01-31 MED ORDER — KETOROLAC TROMETHAMINE 15 MG/ML IJ SOLN
15.0000 mg | Freq: Four times a day (QID) | INTRAMUSCULAR | Status: DC | PRN
  Filled 2021-01-31: qty 1

## 2021-01-31 MED ORDER — SODIUM CHLORIDE 0.9 % IV BOLUS
1000.0000 mL | Freq: Once | INTRAVENOUS | Status: AC
  Administered 2021-01-31: 05:00:00 1000 mL via INTRAVENOUS

## 2021-01-31 MED ORDER — KETOROLAC TROMETHAMINE 30 MG/ML IJ SOLN
30.0000 mg | Freq: Once | INTRAMUSCULAR | Status: AC
  Administered 2021-01-31: 15:00:00 30 mg via INTRAVENOUS
  Filled 2021-01-31: qty 1

## 2021-01-31 MED ORDER — SODIUM CHLORIDE 0.9 % IV BOLUS
500.0000 mL | Freq: Once | INTRAVENOUS | Status: AC
  Administered 2021-01-31: 500 mL via INTRAVENOUS

## 2021-01-31 MED ORDER — SODIUM CHLORIDE 0.9 % IV SOLN
2.0000 g | INTRAVENOUS | Status: DC
  Administered 2021-01-31 – 2021-02-01 (×2): 2 g via INTRAVENOUS
  Filled 2021-01-31: qty 20
  Filled 2021-01-31 (×2): qty 2

## 2021-01-31 MED ORDER — SODIUM CHLORIDE 0.9 % IV BOLUS
1000.0000 mL | Freq: Once | INTRAVENOUS | Status: AC
  Administered 2021-01-31: 1000 mL via INTRAVENOUS

## 2021-01-31 NOTE — Progress Notes (Signed)
   01/31/21 1156  Assess: MEWS Score  Temp (!) 100.5 F (38.1 C)  BP (!) 100/58  Pulse Rate (!) 106  Resp 20  SpO2 99 %  O2 Device Room Air  Yellow MEWS, flagged. Messaged MD per request of patient for tylenol. Notified of the MEWS. Discussed with charge nurse Debi on MEWS protocol. Will monitor patient.

## 2021-01-31 NOTE — Progress Notes (Signed)
Dr. Para March made aware of bp and pt dizziness with ambulating to bathroom. No new orders received.   01/31/21 0212  Vitals  Temp 98.1 F (36.7 C)  Temp Source Oral  BP (!) 84/59  MAP (mmHg) 68  BP Location Left Arm  BP Method Automatic  Patient Position (if appropriate) Lying  Pulse Rate 79  Resp 16  MEWS COLOR  MEWS Score Color Green  Oxygen Therapy  SpO2 100 %  O2 Device Room Air  MEWS Score  MEWS Temp 0  MEWS Systolic 1  MEWS Pulse 0  MEWS RR 0  MEWS LOC 0  MEWS Score 1

## 2021-01-31 NOTE — Significant Event (Signed)
Rapid Response Event Note   Reason for Call :  Red MEWS, SBP<90, HR elevated 120s-130s ST, febrile  Initial Focused Assessment:  Rapid response RN arrived in patient's room with patient's RN preparing IV fluid bolus. Patient lying in bed, flushed and with heat that can be easily felt when hand placed near her skin. Patient admitted yesterday evening with pyelonephritis and Code Sepsis called yesterday in the ED 7/12 at 16:41. Blood cultures and urine cultures with no growth yet. Patient already received tyelnol around midday and nurse had toradol to give after bolus started. Patient's nurse had already touched base with MD to get orders for bolus and toradol. BP at 14:15 87/52 MAP 64, HR upon RRT arrival 133 ST, respirations 20 unlabored, oxygen saturations 96% on room air.  Interventions:  Helped monitor patient while bolus infusing. Rechecked vital signs (minus temperature) at 14:53 which showed BP 96/56, MAP 69, HR 112 ST, RR 18 unlabored, oxygen saturations 98% on room air. Patient educated on infection treatments, sepsis, and current plan of care.   Plan of Care:  Patient with Code Sepsis already called in last 24 hours with cultures obtained then. Spoke with Pam RN at [facility] and Dr. Allena Katz, no plan to recall code sepsis at this time, but MD ordered serial lactic acid checks. Plan to for 1C staff recheck vital signs after bolus and based on vital signs MD will decide if more fluid needed.   Event Summary:   MD Notified: Dr. Allena Katz Call Time: 14:26 Arrival Time: 14:28 End Time: 15:10  Bennie Dallas, RN

## 2021-01-31 NOTE — Progress Notes (Signed)
   01/31/21 1415  Assess: MEWS Score  BP (!) 87/52  Pulse Rate (!) 124  Resp 18  SpO2 97 %  O2 Device Room Air  Assess: MEWS Score  MEWS Temp 1  MEWS Systolic 1  MEWS Pulse 2  MEWS RR 0  MEWS LOC 0  MEWS Score 4  MEWS Score Color Red  Assess: if the MEWS score is Yellow or Red  Were vital signs taken at a resting state? Yes  Focused Assessment Change from prior assessment (see assessment flowsheet)  Does the patient meet 2 or more of the SIRS criteria? Yes  Does the patient have a confirmed or suspected source of infection? Yes  Provider and Rapid Response Notified? Yes  MEWS guidelines implemented *See Row Information* Yes  Treat  MEWS Interventions Escalated (See documentation below) (notified Chrasarge nurse, MD called RRT)  Pain Scale 0-10  Pain Score 0  Take Vital Signs  Increase Vital Sign Frequency  Red: Q 1hr X 4 then Q 4hr X 4, if remains red, continue Q 4hrs  Escalate  MEWS: Escalate Red: discuss with charge nurse/RN and provider, consider discussing with RRT  Notify: Charge Nurse/RN  Name of Charge Nurse/RN Notified Debi, Rn  Date Charge Nurse/RN Notified 01/31/21  Time Charge Nurse/RN Notified 1415  Notify: Provider  Provider Name/Title Patel  Date Provider Notified 01/31/21  Time Provider Notified 1415  Notification Type  (secure message)  Notification Reason Change in status  Provider response See new orders  Date of Provider Response  (See orders)  Time of Provider Response 1424  Notify: Rapid Response  Name of Rapid Response RN Notified Sarah, RN  Date Rapid Response Notified 01/31/21  Time Rapid Response Notified 1415  Document  Patient Outcome Stabilized after interventions  Progress note created (see row info) Yes  Assess: SIRS CRITERIA  SIRS Temperature  0  SIRS Pulse 1  SIRS Respirations  0  SIRS WBC 1  SIRS Score Sum  2

## 2021-01-31 NOTE — Progress Notes (Signed)
Assumed care of patient.

## 2021-01-31 NOTE — Progress Notes (Signed)
   01/31/21 1415  Assess: MEWS Score  BP (!) 87/52  Pulse Rate (!) 124  Resp 18  SpO2 97 %  O2 Device Room Air  Patient MEWS RED.Marland Kitchen Charge nurse notified. MD notified, RRT called. 1L bolus given, IV Toradol given. Will monitor

## 2021-01-31 NOTE — Progress Notes (Signed)
Patient pending shower. IV maintenance disconnected.  Patient awake, alert. No distress. Recently febrile, currently resolving fever diaphoresis.

## 2021-01-31 NOTE — Progress Notes (Addendum)
   01/31/21 0444 01/31/21 0448  Vitals  Temp (!) 97.3 F (36.3 C)  --   Temp Source Oral  --   BP (!) 77/61 91/62 (put pt in [place])  MAP (mmHg) 67 72  BP Location Left Arm Left Arm  BP Method Automatic Automatic  Patient Position (if appropriate) Lying Lying  Pulse Rate 87  --   Resp 18  --   MEWS COLOR  MEWS Score Color Yellow Green  Oxygen Therapy  SpO2 100 %  --   Dr. Para March messaged regarding hypotension at rest and the pt is diaphoretic. Pt placed in trendelenburg and bp improved. MD msg regarding improved bp due to trended pt. NS bolus ordered and infusing 1000 ml bolus at this time.

## 2021-01-31 NOTE — Progress Notes (Signed)
[facility] Progress Note  Patient: Eileen Dudley    MBE:[id number]  DOA: 01/30/2021     Date of Service: the patient was seen and examined on 01/31/2021  Brief hospital course: Past medical history of depression, recurrent UTI.  Presents with complaints of left flank pain with fever and chills.  Found to have pyonephritis of left kidney.  Currently receiving IV antibiotics  Subjective: Continues to report mild left flank pain.  Also has dizziness and fatigue and tiredness.  Also reports chills but no fever.  No nausea no vomiting.  No diarrhea.  No bleeding. Last period was 2 weeks ago.  Assessment and Plan: 1.  Severe sepsis, present on admission secondary to left-sided pyelonephritis Met SIRS criteria on admission with hypotension, tachycardia and tachypnea. Received IV hydration. Persistently remains hypotensive.  We will provide IV hydration again. Initially started on IV ceftriaxone.  We will increase the dose of 2 g daily. Urine cultures blood cultures currently pending. CT negative for any stone or obstruction or abscess so far. Monitor.  2.  Hypokalemia Hypomagnesemia Currently corrected. Monitor.  3.  Anemia Likely chronic in nature. Acute component most likely secondary to dilution rather than acute bleeding. Monitor.  4.  We will check urine pregnancy test for the patient.  5.  Coagulopathy INR mildly elevated in the setting of sepsis.  Monitor.  6.  Acute kidney injury, present on admission. Serum creatinine on admission 1.21. Currently corrected to 0.90 after hydration. Continue with IV fluids.  Scheduled Meds:  enoxaparin (LOVENOX) injection  40 mg Subcutaneous Q24H   sodium chloride flush  3 mL Intravenous Q12H   Continuous Infusions:  cefTRIAXone (ROCEPHIN)  IV     lactated ringers 125 mL/hr at 01/31/21 0947   PRN Meds: acetaminophen **OR** acetaminophen, ondansetron **OR** ondansetron (ZOFRAN) IV  Body mass index is 26.6 kg/m.         DVT Prophylaxis:   enoxaparin (LOVENOX) injection 40 mg Start: 01/30/21 2200    Advance goals of care discussion: Pt is Full code.  Family Communication: no family was present at bedside, at the time of interview.   Data Reviewed: I have personally reviewed and interpreted daily labs, tele strips, imaging. WBC improving.  Serum creatinine improving.  Potassium corrected.  Physical Exam:  General: Appear in mild distress, no Rash; Oral Mucosa Clear, moist. no Abnormal Neck Mass Or lumps, Conjunctiva normal  Cardiovascular: S1 and S2 Present, no Murmur, Respiratory: good respiratory effort, Bilateral Air entry present and CTA, no Crackles, no wheezes Abdomen: Bowel Sound present, Soft and left flank tenderness Extremities: no Pedal edema Neurology: alert and oriented to time, place, and person affect appropriate. no new focal deficit Gait not checked due to patient safety concerns  Vitals:   01/31/21 0523 01/31/21 0559 01/31/21 0648 01/31/21 0834  BP: (!) 85/62 90/64 90/70  96/70  Pulse: 81  95 99  Resp:    18  Temp:    98.5 F (36.9 C)  TempSrc:    Oral  SpO2:    98%  Weight:      Height:        Disposition:  Status is: Observation   Dispo: The patient is from: Home              Anticipated d/c is to: Home              Patient currently is not medically stable to d/c.   Difficult to place patient No  Time spent: 35 minutes. I reviewed all nursing notes, pharmacy notes, vitals, pertinent old records. I have discussed plan of care as described above with RN.  Author: Berle Mull, MD [facility] 01/31/2021 11:30 AM  To reach On-call, see care teams to locate the attending and reach out via [email or web address]. Between 7PM-7AM, please contact night-coverage If you still have difficulty reaching the attending provider, please page the [facility] (Director on Call) for [facility] on amion for assistance.

## 2021-02-01 LAB — BASIC METABOLIC PANEL
Anion gap: 6 (ref 5–15)
BUN: 6 mg/dL (ref 6–20)
CO2: 23 mmol/L (ref 22–32)
Calcium: 7.7 mg/dL — ABNORMAL LOW (ref 8.9–10.3)
Chloride: 110 mmol/L (ref 98–111)
Creatinine, Ser: 0.61 mg/dL (ref 0.44–1.00)
GFR, Estimated: >60 mL/min (ref >60)
Glucose, Bld: 112 mg/dL — ABNORMAL HIGH (ref 70–99)
Potassium: 3.2 mmol/L — ABNORMAL LOW (ref 3.5–5.1)
Sodium: 139 mmol/L (ref 135–145)

## 2021-02-01 LAB — CBC
HCT: 29.6 % — ABNORMAL LOW (ref 36.0–46.0)
Hemoglobin: 9.8 g/dL — ABNORMAL LOW (ref 12.0–15.0)
MCH: 28.7 pg (ref 26.0–34.0)
MCHC: 33.1 g/dL (ref 30.0–36.0)
MCV: 86.5 fL (ref 80.0–100.0)
Platelets: 326 10*3/uL (ref 150–400)
RBC: 3.42 MIL/uL — ABNORMAL LOW (ref 3.87–5.11)
RDW: 16.1 % — ABNORMAL HIGH (ref 11.5–15.5)
WBC: 15.2 10*3/uL — ABNORMAL HIGH (ref 4.0–10.5)
nRBC: 0 % (ref 0.0–0.2)

## 2021-02-01 LAB — MAGNESIUM: Magnesium: 2.4 mg/dL (ref 1.7–2.4)

## 2021-02-01 LAB — URINE CULTURE

## 2021-02-01 MED ORDER — POTASSIUM CHLORIDE CRYS ER 20 MEQ PO TBCR
40.0000 meq | EXTENDED_RELEASE_TABLET | Freq: Once | ORAL | Status: AC
  Administered 2021-02-01: 09:00:00 40 meq via ORAL
  Filled 2021-02-01: qty 2

## 2021-02-01 MED ORDER — ROPINIROLE HCL 1 MG PO TABS: 0.5000 mg | ORAL_TABLET | Freq: Every day | ORAL | Status: DC

## 2021-02-01 MED ORDER — ROPINIROLE HCL 1 MG PO TABS
0.5000 mg | ORAL_TABLET | Freq: Once | ORAL | Status: AC
  Administered 2021-02-01: 0.5 mg via ORAL
  Filled 2021-02-01: qty 1

## 2021-02-01 NOTE — Progress Notes (Signed)
Assumed care at 12am Pt A&O, VSS, IV fluids re-started, tele leads changed and reconnected

## 2021-02-01 NOTE — Progress Notes (Signed)
[facility] Progress Note  Patient: Eileen Dudley    QBV:[id number]  DOA: 01/30/2021     Date of Service: the patient was seen and examined on 02/01/2021  Brief hospital course: Past medical history of depression, recurrent UTI.  Presents with complaints of left flank pain with fever and chills.  Found to have pyonephritis of left kidney.  Currently receiving IV antibiotics  Subjective: Had significant fever and hypotensive episode yesterday.  Currently feeling better.  No nausea no vomiting.  No dizziness or lightheadedness.  Assessment and Plan: 1.  Severe sepsis, present on admission secondary to left-sided pyelonephritis Met SIRS criteria on admission with hypotension, tachycardia and tachypnea. Received IV hydration. Persistently remains hypotensive. Continue with IV hydration as well as IV antibiotics. Urine culture growing multiple species. Blood cultures so far negative. Monitor before discharge to ensure afebrile for 24 hours. CT negative for any stone or obstruction or abscess so far. Monitor.  2.  Hypokalemia Hypomagnesemia Currently corrected. Monitor.  3.  Anemia Likely chronic in nature. Acute component most likely secondary to dilution rather than acute bleeding. Monitor.  4.  Pregnancy test negative.  5.  Coagulopathy INR mildly elevated in the setting of sepsis.  Monitor.  6.  Acute kidney injury, present on admission. Serum creatinine on admission 1.21. Currently corrected to 0.90 after hydration. Continue with IV fluids.  Scheduled Meds:  enoxaparin (LOVENOX) injection  40 mg Subcutaneous Q24H   sodium chloride flush  3 mL Intravenous Q12H   Continuous Infusions:  cefTRIAXone (ROCEPHIN)  IV Stopped (01/31/21 2303)   lactated ringers 125 mL/hr at 02/01/21 0424   PRN Meds: acetaminophen **OR** acetaminophen, hydrOXYzine, ketorolac, ondansetron **OR** ondansetron (ZOFRAN) IV  Body mass index is 26.6 kg/m.        DVT Prophylaxis:    enoxaparin (LOVENOX) injection 40 mg Start: 01/30/21 2200    Advance goals of care discussion: Pt is Full code.  Family Communication: family was present at bedside, at the time of interview.   Data Reviewed: I have personally reviewed and interpreted daily labs, tele strips, imaging. WBC improving.  Potassium level stable.  Physical Exam:  General: Appear in mild distress, no Rash; Oral Mucosa Clear, moist. no Abnormal Neck Mass Or lumps, Conjunctiva normal  Cardiovascular: S1 and S2 Present, no Murmur, Respiratory: good respiratory effort, Bilateral Air entry present and CTA, no Crackles, no wheezes Abdomen: Bowel Sound present, Soft and left CVA tenderness Extremities: no Pedal edema Neurology: alert and oriented to time, place, and person affect appropriate. no new focal deficit Gait not checked due to patient safety concerns   Vitals:   02/01/21 0433 02/01/21 0717 02/01/21 1121 02/01/21 1556  BP: 107/74 108/85 116/81 124/87  Pulse: 77 90 (!) 103 85  Resp: _0 Temp: (!) 97.5 F (36.4 C) 97.9 F (36.6 C) 98.2 F (36.8 C) 98.1 F (36.7 C)  TempSrc: Oral     SpO2: 100% 100%  100%  Weight:      Height:        Disposition:  Status is: Observation   Dispo: The patient is from: Home              Anticipated d/c is to: Home              Patient currently is not medically stable to d/c.   Difficult to place patient No        Time spent: 35 minutes. I reviewed all nursing notes, pharmacy notes, vitals, pertinent  old records. I have discussed plan of care as described above with RN.  Author: Berle Mull, MD [facility] 02/01/2021 6:59 PM  To reach On-call, see care teams to locate the attending and reach out via [email or web address]. Between 7PM-7AM, please contact night-coverage If you still have difficulty reaching the attending provider, please page the [facility] (Director on Call) for [facility] on amion for assistance.

## 2021-02-02 LAB — CBC
HCT: 28.5 % — ABNORMAL LOW (ref 36.0–46.0)
Hemoglobin: 9.5 g/dL — ABNORMAL LOW (ref 12.0–15.0)
MCH: 28.5 pg (ref 26.0–34.0)
MCHC: 33.3 g/dL (ref 30.0–36.0)
MCV: 85.6 fL (ref 80.0–100.0)
Platelets: 427 10*3/uL — ABNORMAL HIGH (ref 150–400)
RBC: 3.33 MIL/uL — ABNORMAL LOW (ref 3.87–5.11)
RDW: 16.6 % — ABNORMAL HIGH (ref 11.5–15.5)
WBC: 11.7 10*3/uL — ABNORMAL HIGH (ref 4.0–10.5)
nRBC: 0 % (ref 0.0–0.2)

## 2021-02-02 LAB — BASIC METABOLIC PANEL
Anion gap: 5 (ref 5–15)
BUN: <5 mg/dL — ABNORMAL LOW (ref 6–20)
CO2: 25 mmol/L (ref 22–32)
Calcium: 8 mg/dL — ABNORMAL LOW (ref 8.9–10.3)
Chloride: 110 mmol/L (ref 98–111)
Creatinine, Ser: 0.65 mg/dL (ref 0.44–1.00)
GFR, Estimated: >60 mL/min (ref >60)
Glucose, Bld: 105 mg/dL — ABNORMAL HIGH (ref 70–99)
Potassium: 3.4 mmol/L — ABNORMAL LOW (ref 3.5–5.1)
Sodium: 140 mmol/L (ref 135–145)

## 2021-02-02 LAB — MAGNESIUM: Magnesium: 1.9 mg/dL (ref 1.7–2.4)

## 2021-02-02 MED ORDER — SULFAMETHOXAZOLE-TRIMETHOPRIM 800-160 MG PO TABS
1.0000 | ORAL_TABLET | Freq: Two times a day (BID) | ORAL | Status: DC
  Filled 2021-02-02: qty 1

## 2021-02-02 MED ORDER — SULFAMETHOXAZOLE-TRIMETHOPRIM 800-160 MG PO TABS: 1.0000 | ORAL_TABLET | Freq: Two times a day (BID) | ORAL | 0 refills | Status: AC

## 2021-02-02 NOTE — Progress Notes (Signed)
Eileen Dudley to be D/C'd  per MD order.  Discussed with the patient and all questions fully answered.  VSS, Skin clean, dry and intact without evidence of skin break down, no evidence of skin tears noted.  IV catheter discontinued intact. Site without signs and symptoms of complications. Dressing and pressure applied.  An After Visit Summary was printed and given to the patient. Patient received prescription.  D/c education completed with patient/family including follow up instructions, medication list, d/c activities limitations if indicated, with other d/c instructions as indicated by MD - patient able to verbalize understanding, all questions fully answered.   Patient instructed to return to ED, call 911, or call MD for any changes in condition.   Patient to be escorted via WC, and D/C home via private auto.

## 2021-02-02 NOTE — Progress Notes (Signed)
   02/02/21 0106  Assess: MEWS Score  Temp (!) 100.5 F (38.1 C)  BP 135/80  Pulse Rate (!) 124  Resp 16  SpO2 100 %  O2 Device Room Air  Assess: MEWS Score  MEWS Temp 1  MEWS Systolic 0  MEWS Pulse 2  MEWS RR 0  MEWS LOC 0  MEWS Score 3  MEWS Score Color Yellow  Assess: if the MEWS score is Yellow or Red  Were vital signs taken at a resting state? Yes  Focused Assessment No change from prior assessment  Does the patient meet 2 or more of the SIRS criteria? No  Does the patient have a confirmed or suspected source of infection? Yes  Provider and Rapid Response Notified? No (Notified Charge Nurse, Marny Lowenstein, RN)  MEWS guidelines implemented *See Row Information* Yes  Treat  MEWS Interventions Administered prn meds/treatments  Take Vital Signs  Increase Vital Sign Frequency  Yellow: Q 2hr X 2 then Q 4hr X 2, if remains yellow, continue Q 4hrs  Escalate  MEWS: Escalate Yellow: discuss with charge nurse/RN and consider discussing with provider and RRT  Notify: Charge Nurse/RN  Name of Charge Nurse/RN Notified Marny Lowenstein, RN  Date Charge Nurse/RN Notified 02/02/21  Time Charge Nurse/RN Notified 0124  Document  Progress note created (see row info) Yes  Assess: SIRS CRITERIA  SIRS Temperature  0  SIRS Pulse 1  SIRS Respirations  0  SIRS WBC 0  SIRS Score Sum  1

## 2021-02-03 NOTE — Discharge Summary (Signed)
[facility] Discharge Summary   Patient: Eileen Dudley HMC:[id number]  PCP: Patient, No Pcp Per (Inactive)  Date of admission: 01/30/2021   Date of discharge: 02/02/2021      Discharge Diagnoses:  Principal Problem:   Severe sepsis [facility]) Active Problems:   Pyelonephritis of left kidney   Hypokalemia   Sepsis ([place])   Admitted From: Home Disposition:  Home   Recommendations for Outpatient Follow-up:  PCP: Follow-up with PCP in 1 week.   Follow-up Information     PCP. Schedule an appointment as soon as possible for a visit in 1 week(s).                 Discharge Instructions     Diet general   Complete by: As directed    Increase activity slowly   Complete by: As directed       Diet recommendation: Regular diet  Activity: The patient is advised to gradually reintroduce usual activities, as tolerated  Discharge Condition: stable  Code Status: Full code   History of present illness: As per the H and P dictated on admission, "Eileen Dudley is a 30 y.o. female with medical history significant for prediabetes, depression, recurrent UTI who presented to the ED for evaluation of back pain, fevers, chills.   Patient initially had symptoms of fevers, chills, and concern for vaginal yeast infection about 2 weeks ago.  On June 28 she was diagnosed and treated for bacterial vaginitis and Candida vaginitis with a course of Flagyl and Diflucan.  She said her symptoms resolved after starting Flagyl.  She says she completed her course of Flagyl about 2 days ago and since then has been having recurrent fevers, chills, diaphoresis, nausea without emesis.  She has been having intermittent left flank discomfort.  She felt as if she had the flu and went to the urgent care for further evaluation.  In urgent care she was noted to be hypotensive with BP 68/57 and tachycardic with heart rate 133.  She was sent to the ED for further evaluation due to concern for sepsis.   ED  Course:  Initial vitals showed BP 70/55, MAP 63, pulse 129, RR 26, temp 98.7 F, SPO2 93% on room air.   Labs show WBC 31.0, hemoglobin 11.5, platelets 463,000, sodium 134, potassium 2.7, bicarb 25, BUN 10, creatinine 1.21, serum glucose 129, LFTs within normal limits, lactic acid 1.4.   SARS-CoV-2 PCR negative.  Influenza a and B PCR's are negative.  Blood cultures obtained and pending.  Urinalysis and urine culture ordered and pending collection.   Portable chest x-ray negative for focal consolidation, edema, or effusion.   CT renal stone study shows findings suggestive of left-sided pyelonephritis without hydronephrosis.  No obstructive ureteral or bladder calculi visualized.  Punctate nonobstructive right renal stone noted.   Patient was given 3 L LR, IV K 10 mEq x 3, oral K 40 mEq x1, IV vancomycin, cefepime, and Flagyl.  The hospitalist service was consulted to admit for further evaluation and management."  Hospital Course:  Summary of her active problems in the hospital is as following. 1.  Severe sepsis, present on admission secondary to left-sided pyelonephritis Met SIRS criteria on admission with hypotension, tachycardia and tachypnea. Received IV hydration. Persistently remains hypotensive. Treated with IV hydration as well as IV antibiotics. Urine culture growing multiple species. Blood cultures so far negative. CT negative for any stone or obstruction or abscess so far. Patient had some low-grade fever the day before discharge but no  CVA tenderness. Leukocytosis also improved. Likely can go home on oral Bactrim. Recommended patient to ensure adequate hydration and electrolyte balance.  2.  Hypokalemia Hypomagnesemia Currently corrected. Monitor.   3.  Anemia likely associated with chronic menstrual loss. Acute component most likely secondary to dilution rather than acute bleeding. Monitor.   4.  Pregnancy test negative.   5.  Coagulopathy INR mildly elevated in  the setting of sepsis.  Monitor.   6.  Acute kidney injury, present on admission. Serum creatinine on admission 1.21. Currently corrected to 0.90 after hydration. Treated with IV fluids.  Patient was ambulatory without any assistance. On the day of the discharge the patient's vitals were stable, and no other new acute medical condition were reported. The patient was felt safe to be discharge at Home with no therapy needed on discharge.  Consultants: none Procedures: none  DISCHARGE MEDICATION: Allergies as of 02/02/2021   No Known Allergies      Medication List     STOP taking these medications    fluconazole 150 MG tablet Commonly known as: Diflucan   metroNIDAZOLE 500 MG tablet Commonly known as: FLAGYL       TAKE these medications    sulfamethoxazole-trimethoprim 800-160 MG tablet Commonly known as: BACTRIM DS Take 1 tablet by mouth 2 (two) times daily for 7 days.       ASK your doctor about these medications    norethindrone-ethinyl estradiol 1-20 MG-MCG tablet Commonly known as: LOESTRIN Take 1 tablet by mouth daily.        Discharge Exam: Filed Weights   01/30/21 1641  Weight: 74.8 kg   Vitals:   02/02/21 0904 02/02/21 1153  BP: (!) 143/52 124/79  Pulse: 72 94  Resp: 18 18  Temp: 98.5 F (36.9 C) 97.9 F (36.6 C)  SpO2: 100% 100%   General: Appear in mild distress, no Rash; Oral Mucosa Clear, moist. no Abnormal Neck Mass Or lumps, Conjunctiva normal  Cardiovascular: S1 and S2 Present, no Murmur, Respiratory: good respiratory effort, Bilateral Air entry present and CTA, no Crackles, no wheezes Abdomen: Bowel Sound present, Soft and no tenderness Extremities: no Pedal edema Neurology: alert and oriented to time, place, and person affect appropriate. no new focal deficit Gait not checked due to patient safety concerns   The results of significant diagnostics from this hospitalization (including imaging, microbiology, ancillary and  laboratory) are listed below for reference.    Significant Diagnostic Studies: DG Chest Port 1 View  Result Date: 01/30/2021 CLINICAL DATA:  Questionable sepsis EXAM: PORTABLE CHEST 1 VIEW COMPARISON:  None. FINDINGS: The heart size and mediastinal contours are within normal limits. Both lungs are clear. The visualized skeletal structures are unremarkable. IMPRESSION: No active disease. Electronically Signed   By: Dahlia Bailiff MD   On: 01/30/2021 17:50   CT Renal Stone Study  Result Date: 01/30/2021 CLINICAL DATA:  History of UTI completed antibiotics, now with back pain, fever chills and flu like symptoms. EXAM: CT ABDOMEN AND PELVIS WITHOUT CONTRAST TECHNIQUE: Multidetector CT imaging of the abdomen and pelvis was performed following the standard protocol without IV contrast. COMPARISON:  None. FINDINGS: Lower chest: No acute abnormality. Hepatobiliary: No focal liver abnormality is seen. No gallstones, gallbladder wall thickening, or biliary dilatation. Pancreas: Unremarkable. No pancreatic ductal dilatation or surrounding inflammatory changes. Spleen: Normal in size without focal abnormality. Adrenals/Urinary Tract: Bilateral adrenal glands are unremarkable. No hydronephrosis. Punctate nonobstructive 1-2 mm right lower pole renal stone. Edematous appearance of the left kidney with  adjacent inflammatory stranding. No obstructive ureteral or bladder calculi visualized. Urinary bladder is unremarkable for degree of distension. Stomach/Bowel: Stomach is within normal limits. Appendix is not definitely visualized but there is no pericecal inflammation. No evidence of bowel wall thickening, distention, or inflammatory changes. Vascular/Lymphatic: No significant vascular findings are present. No enlarged abdominal or pelvic lymph nodes. Reproductive: Uterus and bilateral adnexa are unremarkable. Other: No pneumoperitoneum or ascites. Musculoskeletal: No acute or significant osseous findings. IMPRESSION: 1.  Edematous appearance of the left kidney with adjacent inflammatory stranding, suggestive of pyelonephritis. No hydronephrosis. 2. No obstructive ureteral or bladder calculi visualized. 3. Punctate nonobstructive right renal stone. Electronically Signed   By: Dahlia Bailiff MD   On: 01/30/2021 17:59    Microbiology: Recent Results (from the past 240 hour(s))  Resp Panel by RT-PCR (Flu A&B, Covid) Nasopharyngeal Swab     Status: None   Collection Time: 01/30/21  3:18 PM   Specimen: Nasopharyngeal Swab; Nasopharyngeal(NP) swabs in vial transport medium  Result Value Ref Range Status   SARS Coronavirus 2 by RT PCR NEGATIVE NEGATIVE Final    Comment: (NOTE) SARS-CoV-2 target nucleic acids are NOT DETECTED.  The SARS-CoV-2 RNA is generally detectable in upper respiratory specimens during the acute phase of infection. The lowest concentration of SARS-CoV-2 viral copies this assay can detect is 138 copies/mL. A negative result does not preclude SARS-Cov-2 infection and should not be used as the sole basis for treatment or other patient management decisions. A negative result may occur with  improper specimen collection/handling, submission of specimen other than nasopharyngeal swab, presence of viral mutation(s) within the areas targeted by this assay, and inadequate number of viral copies(<138 copies/mL). A negative result must be combined with clinical observations, patient history, and epidemiological information. The expected result is Negative.  Fact Sheet for Patients:  EntrepreneurPulse.com.au  Fact Sheet for Healthcare Providers:  IncredibleEmployment.be  This test is no t yet approved or cleared by the [place] FDA and  has been authorized for detection and/or diagnosis of SARS-CoV-2 by FDA under an Emergency Use Authorization (EUA). This EUA will remain  in effect (meaning this test can be used) for the duration of the COVID-19 declaration  under Section 564(b)(1) of the Act, 21 U.S.C.section 360bbb-3(b)(1), unless the authorization is terminated  or revoked sooner.       Influenza A by PCR NEGATIVE NEGATIVE Final   Influenza B by PCR NEGATIVE NEGATIVE Final    Comment: (NOTE) The Xpert Xpress SARS-CoV-2/FLU/RSV plus assay is intended as an aid in the diagnosis of influenza from Nasopharyngeal swab specimens and should not be used as a sole basis for treatment. Nasal washings and aspirates are unacceptable for Xpert Xpress SARS-CoV-2/FLU/RSV testing.  Fact Sheet for Patients: EntrepreneurPulse.com.au  Fact Sheet for Healthcare Providers: IncredibleEmployment.be  This test is not yet approved or cleared by the [place] FDA and has been authorized for detection and/or diagnosis of SARS-CoV-2 by FDA under an Emergency Use Authorization (EUA). This EUA will remain in effect (meaning this test can be used) for the duration of the COVID-19 declaration under Section 564(b)(1) of the Act, 21 U.S.C. section 360bbb-3(b)(1), unless the authorization is terminated or revoked.  Performed at [facility], [address], [place], [place] [address]   Blood Culture (routine x 2)     Status: None (Preliminary result)   Collection Time: 01/30/21  4:37 PM   Specimen: BLOOD  Result Value Ref Range Status   Specimen Description BLOOD LEFT ANTECUBITAL  Final   Special Requests   Final    BOTTLES DRAWN AEROBIC AND ANAEROBIC Blood Culture adequate volume   Culture   Final    NO GROWTH 4 DAYS Performed at [facility], [address] [place], [place] [address]    Report Status PENDING  Incomplete  Blood Culture (routine x 2)     Status: None (Preliminary result)   Collection Time: 01/30/21  4:37 PM   Specimen: BLOOD  Result Value Ref Range Status   Specimen Description BLOOD RIGHT ANTECUBITAL  Final   Special Requests   Final    BOTTLES DRAWN AEROBIC AND ANAEROBIC  Blood Culture adequate volume   Culture   Final    NO GROWTH 4 DAYS Performed at [facility], [address] [place], [place] [address]    Report Status PENDING  Incomplete  Urine culture     Status: Abnormal   Collection Time: 01/30/21  9:08 PM   Specimen: In/Out Cath Urine  Result Value Ref Range Status   Specimen Description   Final    IN/OUT CATH URINE Performed at [facility], [address] [place], [place] [address]    Special Requests   Final    NONE Performed at [facility], [address] [place], [place] [address]    Culture MULTIPLE SPECIES PRESENT, SUGGEST RECOLLECTION (A)  Final   Report Status 02/01/2021 FINAL  Final     Labs: CBC: Recent Labs  Lab 01/30/21 1637 01/31/21 0518 02/01/21 0557 02/02/21 0501  WBC 31.0* 22.8* 15.2* 11.7*  NEUTROABS 26.8*  --   --   --   HGB 11.5* 9.7* 9.8* 9.5*  HCT 34.9* 30.0* 29.6* 28.5*  MCV 87.0 88.0 86.5 85.6  PLT 463* 338 326 847*   Basic Metabolic Panel: Recent Labs  Lab 01/30/21 1637 01/31/21 0518 02/01/21 0557 02/02/21 0501  NA 134* 137 139 140  K 2.7* 4.4 3.2* 3.4*  CL 100 106 110 110  CO2 25 25 23 25   GLUCOSE 129* 124* 112* 105*  BUN 10 8 6  <5*  CREATININE 1.21* 0.90 0.61 0.65  CALCIUM 7.8* 7.5* 7.7* 8.0*  MG 1.6*  --  2.4 1.9   Liver Function Tests: Recent Labs  Lab 01/30/21 1637  AST 20  ALT 13  ALKPHOS 57  BILITOT 0.6  PROT 7.0  ALBUMIN 3.0*   CBG: No results for input(s): GLUCAP in the last 168 hours.  Time spent: 35 minutes  Signed:  Berle Mull  [facility] 02/02/2021

## 2021-02-04 LAB — CULTURE, BLOOD (ROUTINE X 2)
Culture: NO GROWTH
Culture: NO GROWTH
Special Requests: ADEQUATE
Special Requests: ADEQUATE

## 2021-06-22 LAB — OB RESULTS CONSOLE GC/CHLAMYDIA
Chlamydia: NEGATIVE
Neisseria Gonorrhea: NEGATIVE

## 2021-06-22 LAB — OB RESULTS CONSOLE VARICELLA ZOSTER ANTIBODY, IGG: Varicella: IMMUNE

## 2021-06-22 LAB — OB RESULTS CONSOLE HEPATITIS B SURFACE ANTIGEN: Hepatitis B Surface Ag: NEGATIVE

## 2021-06-22 LAB — OB RESULTS CONSOLE RPR: RPR: NONREACTIVE

## 2021-06-22 LAB — OB RESULTS CONSOLE HIV ANTIBODY (ROUTINE TESTING): HIV: NONREACTIVE

## 2021-06-22 LAB — OB RESULTS CONSOLE RUBELLA ANTIBODY, IGM: Rubella: IMMUNE

## 2021-07-20 LAB — OB RESULTS CONSOLE GBS: GBS: POSITIVE

## 2021-07-22 DIAGNOSIS — F53 Postpartum depression: Secondary | ICD-10-CM

## 2021-07-22 HISTORY — DX: Postpartum depression: F53.0

## 2021-07-22 NOTE — L&D Delivery Note (Signed)
Delivery Note  Date of delivery: 12/29/2021 Estimated Date of Delivery: 01/02/22 Patient's last menstrual period was 01/03/2021 (approximate). EGA: [redacted]w[redacted]d  Delivery Note At 4:42 PM a viable female was delivered via Vaginal, Spontaneous (Presentation: Left Occiput Anterior).  APGAR: 7, 9; weight 6 lb 9.8 oz (3000 g).   Placenta status: Spontaneous, Intact.  Cord: 3 vessels with the following complications: None.    First Stage: Labor onset: 0630  Augmentation : none Analgesia /Anesthesia intrapartum: Epidural SROM at [employer] presented to L&D with active labor. She was expectantly managed. Epidural placed for pain relief.   Second Stage: Complete dilation at 1555 Onset of pushing at 1608 FHR second stage Cat I and Cat II Delivery at 1642 on 12/29/2021  She progressed to complete and had a spontaneous vaginal birth of a live female over an intact perineum. The fetal head was delivered in OA position with restitution to LOA. Loose nuchal cord. Anterior then posterior shoulders delivered spontaneously. Baby placed on mom's abdomen and attended to by transition RN. Cord clamped and cut after 3+ min by FOB. Cord blood obtained for newborn labs.  Third Stage: Placenta delivered intact with 3VC at 27 Placenta disposition: routine disposal Uterine tone firm / bleeding min IV pitocin given for hemorrhage prophylaxis  Anesthesia: Epidural Episiotomy: None Lacerations: None Suture Repair: n/a Est. Blood Loss (mL):  [id number]  Complications: loose nuchal cord  Mom to postpartum.  Baby to Couplet care / Skin to Skin.  Newborn: Birth Weight: pending  Apgar Scores: 7/9 Feeding planned: breast   Clydene Laming, CNM 12/29/2021 5:17 PM

## 2021-12-29 ENCOUNTER — Encounter: Payer: Self-pay | Admitting: Obstetrics and Gynecology

## 2021-12-29 ENCOUNTER — Inpatient Hospital Stay: Payer: BC Managed Care – PPO | Admitting: Anesthesiology

## 2021-12-29 ENCOUNTER — Inpatient Hospital Stay
Admission: EM | Admit: 2021-12-29 | Discharge: 2021-12-30 | DRG: 807 | Disposition: A | Discharge status: Home / Self Care | Payer: BC Managed Care – PPO | Attending: Obstetrics and Gynecology | Admitting: Obstetrics and Gynecology

## 2021-12-29 ENCOUNTER — Other Ambulatory Visit: Payer: Self-pay

## 2021-12-29 DIAGNOSIS — O4290 Premature rupture of membranes, unspecified as to length of time between rupture and onset of labor, unspecified weeks of gestation: Secondary | ICD-10-CM | POA: Diagnosis present

## 2021-12-29 DIAGNOSIS — Z3A39 39 weeks gestation of pregnancy: Secondary | ICD-10-CM

## 2021-12-29 DIAGNOSIS — F17290 Nicotine dependence, other tobacco product, uncomplicated: Secondary | ICD-10-CM | POA: Diagnosis present

## 2021-12-29 DIAGNOSIS — F32A Depression, unspecified: Secondary | ICD-10-CM | POA: Diagnosis present

## 2021-12-29 DIAGNOSIS — O9902 Anemia complicating childbirth: Secondary | ICD-10-CM | POA: Diagnosis present

## 2021-12-29 DIAGNOSIS — O99824 Streptococcus B carrier state complicating childbirth: Secondary | ICD-10-CM | POA: Diagnosis present

## 2021-12-29 DIAGNOSIS — O99344 Other mental disorders complicating childbirth: Secondary | ICD-10-CM | POA: Diagnosis present

## 2021-12-29 DIAGNOSIS — O99334 Smoking (tobacco) complicating childbirth: Secondary | ICD-10-CM | POA: Diagnosis present

## 2021-12-29 DIAGNOSIS — O26893 Other specified pregnancy related conditions, third trimester: Secondary | ICD-10-CM | POA: Diagnosis present

## 2021-12-29 LAB — URINE DRUG SCREEN, QUALITATIVE (ARMC ONLY)
Amphetamines, Ur Screen: NOT DETECTED
Barbiturates, Ur Screen: NOT DETECTED
Benzodiazepine, Ur Scrn: NOT DETECTED
Cannabinoid 50 Ng, Ur ~~LOC~~: NOT DETECTED
Cocaine Metabolite,Ur ~~LOC~~: NOT DETECTED
MDMA (Ecstasy)Ur Screen: NOT DETECTED
Methadone Scn, Ur: NOT DETECTED
Opiate, Ur Screen: NOT DETECTED
Phencyclidine (PCP) Ur S: NOT DETECTED
Tricyclic, Ur Screen: NOT DETECTED

## 2021-12-29 LAB — CBC
HCT: 39.9 % (ref 36.0–46.0)
Hemoglobin: 12.6 g/dL (ref 12.0–15.0)
MCH: 25.6 pg — ABNORMAL LOW (ref 26.0–34.0)
MCHC: 31.6 g/dL (ref 30.0–36.0)
MCV: 80.9 fL (ref 80.0–100.0)
Platelets: 198 10*3/uL (ref 150–400)
RBC: 4.93 MIL/uL (ref 3.87–5.11)
RDW: 17.5 % — ABNORMAL HIGH (ref 11.5–15.5)
WBC: 11.7 10*3/uL — ABNORMAL HIGH (ref 4.0–10.5)
nRBC: 0 % (ref 0.0–0.2)

## 2021-12-29 LAB — COMPREHENSIVE METABOLIC PANEL
ALT: 12 U/L (ref 0–44)
AST: 19 U/L (ref 15–41)
Albumin: 2.8 g/dL — ABNORMAL LOW (ref 3.5–5.0)
Alkaline Phosphatase: 186 U/L — ABNORMAL HIGH (ref 38–126)
Anion gap: 7 (ref 5–15)
BUN: 13 mg/dL (ref 6–20)
CO2: 20 mmol/L — ABNORMAL LOW (ref 22–32)
Calcium: 8.5 mg/dL — ABNORMAL LOW (ref 8.9–10.3)
Chloride: 109 mmol/L (ref 98–111)
Creatinine, Ser: 0.75 mg/dL (ref 0.44–1.00)
GFR, Estimated: >60 mL/min (ref >60)
Glucose, Bld: 88 mg/dL (ref 70–99)
Potassium: 3.9 mmol/L (ref 3.5–5.1)
Sodium: 136 mmol/L (ref 135–145)
Total Bilirubin: 0.3 mg/dL (ref 0.3–1.2)
Total Protein: 6.8 g/dL (ref 6.5–8.1)

## 2021-12-29 LAB — PROTEIN / CREATININE RATIO, URINE
Creatinine, Urine: 131 mg/dL
Protein Creatinine Ratio: 0.21 mg/mg{Cre} — ABNORMAL HIGH (ref 0.00–0.15)
Total Protein, Urine: 28 mg/dL

## 2021-12-29 LAB — TYPE AND SCREEN
ABO/RH(D): O POS
Antibody Screen: NEGATIVE

## 2021-12-29 LAB — RUPTURE OF MEMBRANE (ROM)PLUS: Rom Plus: POSITIVE

## 2021-12-29 LAB — ABO/RH: ABO/RH(D): O POS

## 2021-12-29 MED ORDER — OXYTOCIN-SODIUM CHLORIDE 30-0.9 UT/500ML-% IV SOLN
2.5000 [IU]/h | INTRAVENOUS | Status: DC
  Administered 2021-12-29: 2.5 [IU]/h via INTRAVENOUS

## 2021-12-29 MED ORDER — OXYCODONE HCL 5 MG PO TABS: 10.0000 mg | ORAL_TABLET | ORAL | Status: DC | PRN

## 2021-12-29 MED ORDER — OXYCODONE-ACETAMINOPHEN 5-325 MG PO TABS: 1.0000 | ORAL_TABLET | ORAL | Status: DC | PRN

## 2021-12-29 MED ORDER — ACETAMINOPHEN 325 MG PO TABS: 650.0000 mg | ORAL_TABLET | ORAL | Status: DC | PRN

## 2021-12-29 MED ORDER — AMMONIA AROMATIC IN INHA
RESPIRATORY_TRACT | Status: AC
  Filled 2021-12-29: qty 10

## 2021-12-29 MED ORDER — SIMETHICONE 80 MG PO CHEW: 80.0000 mg | CHEWABLE_TABLET | ORAL | Status: DC | PRN

## 2021-12-29 MED ORDER — OXYTOCIN 10 UNIT/ML IJ SOLN
INTRAMUSCULAR | Status: AC
  Filled 2021-12-29: qty 2

## 2021-12-29 MED ORDER — WITCH HAZEL-GLYCERIN EX PADS
1.0000 "application " | MEDICATED_PAD | CUTANEOUS | Status: DC | PRN
  Filled 2021-12-29: qty 100

## 2021-12-29 MED ORDER — DIPHENHYDRAMINE HCL 25 MG PO CAPS: 25.0000 mg | ORAL_CAPSULE | Freq: Four times a day (QID) | ORAL | Status: DC | PRN

## 2021-12-29 MED ORDER — SODIUM CHLORIDE 0.9 % IV SOLN
INTRAVENOUS | Status: AC
  Administered 2021-12-29: 5 10*6.[IU] via INTRAVENOUS
  Filled 2021-12-29: qty 5

## 2021-12-29 MED ORDER — OXYCODONE HCL 5 MG PO TABS: 5.0000 mg | ORAL_TABLET | ORAL | Status: DC | PRN

## 2021-12-29 MED ORDER — BENZOCAINE-MENTHOL 20-0.5 % EX AERO
1.0000 "application " | INHALATION_SPRAY | CUTANEOUS | Status: DC | PRN
  Filled 2021-12-29 (×2): qty 56

## 2021-12-29 MED ORDER — IBUPROFEN 600 MG PO TABS
600.0000 mg | ORAL_TABLET | Freq: Four times a day (QID) | ORAL | Status: DC
  Administered 2021-12-29 – 2021-12-30 (×4): 600 mg via ORAL
  Filled 2021-12-29 (×4): qty 1

## 2021-12-29 MED ORDER — SODIUM CHLORIDE 0.9 % IV SOLN
INTRAVENOUS | Status: DC | PRN
  Administered 2021-12-29: 5 mL via EPIDURAL
  Administered 2021-12-29: 7 mL via EPIDURAL

## 2021-12-29 MED ORDER — DIBUCAINE (PERIANAL) 1 % EX OINT
1.0000 "application " | TOPICAL_OINTMENT | CUTANEOUS | Status: DC | PRN
  Filled 2021-12-29: qty 28

## 2021-12-29 MED ORDER — FENTANYL CITRATE (PF) 100 MCG/2ML IJ SOLN
INTRAMUSCULAR | Status: AC
  Filled 2021-12-29: qty 2

## 2021-12-29 MED ORDER — LIDOCAINE HCL (PF) 1 % IJ SOLN: 30.0000 mL | INTRAMUSCULAR | Status: DC | PRN

## 2021-12-29 MED ORDER — PRENATAL MULTIVITAMIN CH
1.0000 | ORAL_TABLET | Freq: Every day | ORAL | Status: DC
  Administered 2021-12-30: 1 via ORAL
  Filled 2021-12-29: qty 1

## 2021-12-29 MED ORDER — LACTATED RINGERS IV SOLN: INTRAVENOUS | Status: DC

## 2021-12-29 MED ORDER — SOD CITRATE-CITRIC ACID 500-334 MG/5ML PO SOLN: 30.0000 mL | ORAL | Status: DC | PRN

## 2021-12-29 MED ORDER — DIPHENHYDRAMINE HCL 50 MG/ML IJ SOLN: 12.5000 mg | INTRAMUSCULAR | Status: DC | PRN

## 2021-12-29 MED ORDER — COCONUT OIL OIL
1.0000 "application " | TOPICAL_OIL | Status: DC | PRN
  Administered 2021-12-29: 1 via TOPICAL
  Filled 2021-12-29: qty 120

## 2021-12-29 MED ORDER — EPHEDRINE 5 MG/ML INJ: 10.0000 mg | INTRAVENOUS | Status: DC | PRN

## 2021-12-29 MED ORDER — ONDANSETRON HCL 4 MG/2ML IJ SOLN: 4.0000 mg | INTRAMUSCULAR | Status: DC | PRN

## 2021-12-29 MED ORDER — OXYTOCIN BOLUS FROM INFUSION: 333.0000 mL | Freq: Once | INTRAVENOUS | Status: AC

## 2021-12-29 MED ORDER — LIDOCAINE-EPINEPHRINE (PF) 1.5 %-1:200000 IJ SOLN
INTRAMUSCULAR | Status: DC | PRN
  Administered 2021-12-29: 3 mL via EPIDURAL

## 2021-12-29 MED ORDER — MISOPROSTOL 200 MCG PO TABS
ORAL_TABLET | ORAL | Status: AC
  Filled 2021-12-29: qty 4

## 2021-12-29 MED ORDER — FENTANYL-BUPIVACAINE-NACL 0.5-0.125-0.9 MG/250ML-% EP SOLN
12.0000 mL/h | EPIDURAL | Status: DC | PRN
  Administered 2021-12-29: 12 mL/h via EPIDURAL
  Filled 2021-12-29: qty 250

## 2021-12-29 MED ORDER — OXYCODONE-ACETAMINOPHEN 5-325 MG PO TABS: 2.0000 | ORAL_TABLET | ORAL | Status: DC | PRN

## 2021-12-29 MED ORDER — ESCITALOPRAM OXALATE 10 MG PO TABS
20.0000 mg | ORAL_TABLET | Freq: Every day | ORAL | Status: DC
  Administered 2021-12-29 – 2021-12-30 (×2): 20 mg via ORAL
  Filled 2021-12-29 (×2): qty 2

## 2021-12-29 MED ORDER — PENICILLIN G POT IN DEXTROSE 60000 UNIT/ML IV SOLN
3.0000 10*6.[IU] | INTRAVENOUS | Status: DC
  Administered 2021-12-29: 3 10*6.[IU] via INTRAVENOUS
  Filled 2021-12-29 (×3): qty 50

## 2021-12-29 MED ORDER — LIDOCAINE HCL (PF) 1 % IJ SOLN
INTRAMUSCULAR | Status: AC
  Filled 2021-12-29: qty 30

## 2021-12-29 MED ORDER — TETANUS-DIPHTH-ACELL PERTUSSIS 5-2.5-18.5 LF-MCG/0.5 IM SUSY
0.5000 mL | PREFILLED_SYRINGE | Freq: Once | INTRAMUSCULAR | Status: DC
  Filled 2021-12-29: qty 0.5

## 2021-12-29 MED ORDER — LACTATED RINGERS IV SOLN: 500.0000 mL | INTRAVENOUS | Status: DC | PRN

## 2021-12-29 MED ORDER — SENNOSIDES-DOCUSATE SODIUM 8.6-50 MG PO TABS
2.0000 | ORAL_TABLET | Freq: Every day | ORAL | Status: DC
  Administered 2021-12-30: 2 via ORAL
  Filled 2021-12-29: qty 2

## 2021-12-29 MED ORDER — PHENYLEPHRINE 80 MCG/ML (10ML) SYRINGE FOR IV PUSH (FOR BLOOD PRESSURE SUPPORT): 80.0000 ug | PREFILLED_SYRINGE | INTRAVENOUS | Status: DC | PRN

## 2021-12-29 MED ORDER — LACTATED RINGERS IV SOLN
500.0000 mL | Freq: Once | INTRAVENOUS | Status: AC
  Administered 2021-12-29: 500 mL via INTRAVENOUS

## 2021-12-29 MED ORDER — FERROUS SULFATE 325 (65 FE) MG PO TABS
325.0000 mg | ORAL_TABLET | Freq: Two times a day (BID) | ORAL | Status: DC
  Administered 2021-12-30 (×2): 325 mg via ORAL
  Filled 2021-12-29 (×2): qty 1

## 2021-12-29 MED ORDER — ONDANSETRON HCL 4 MG PO TABS: 4.0000 mg | ORAL_TABLET | ORAL | Status: DC | PRN

## 2021-12-29 MED ORDER — ONDANSETRON HCL 4 MG/2ML IJ SOLN: 4.0000 mg | Freq: Four times a day (QID) | INTRAMUSCULAR | Status: DC | PRN

## 2021-12-29 MED ORDER — FENTANYL CITRATE (PF) 100 MCG/2ML IJ SOLN
50.0000 ug | INTRAMUSCULAR | Status: DC | PRN
  Administered 2021-12-29: 100 ug via INTRAVENOUS

## 2021-12-29 MED ORDER — OXYTOCIN-SODIUM CHLORIDE 30-0.9 UT/500ML-% IV SOLN
INTRAVENOUS | Status: AC
  Administered 2021-12-29: 333 mL via INTRAVENOUS
  Filled 2021-12-29: qty 500

## 2021-12-29 MED ORDER — LIDOCAINE HCL (PF) 1 % IJ SOLN
INTRAMUSCULAR | Status: DC | PRN
  Administered 2021-12-29: 3 mL

## 2021-12-29 MED ORDER — SODIUM CHLORIDE 0.9 % IV SOLN: 5.0000 10*6.[IU] | Freq: Once | INTRAVENOUS | Status: AC

## 2021-12-29 MED ORDER — ACETAMINOPHEN 325 MG PO TABS
650.0000 mg | ORAL_TABLET | ORAL | Status: DC | PRN
  Administered 2021-12-30 (×2): 650 mg via ORAL
  Filled 2021-12-29 (×2): qty 2

## 2021-12-29 NOTE — Progress Notes (Signed)
Labor Progress Note  Eileen Dudley is a 31 y.o. G1P0 at [redacted]w[redacted]d by LMP admitted for active labor  Subjective: Pt is feeling comfortable now with epidural. Partner at [facility] and very supportive.  Objective: BP (!) 110/92   Pulse 84   Temp 98.3 F (36.8 C) (Oral)   Resp 20   Ht 5\' 6"  (1.676 m)   Wt 94.8 kg   LMP 01/03/2021 (Approximate) Comment: neg preg test 01/30/21  SpO2 99%   BMI 33.73 kg/m   Fetal Assessment: FHT:  FHR: 145 bpm, variability: moderate,  accelerations:  Present,  decelerations:  Present variable Category/reactivity:  Category I UC:   regular, every 3-5 minutes SVE:    Dilation: 8cm  Effacement: 80%  Station:  +1  Consistency: soft  Position: anterior  Membrane status: SROM at 0630 Amniotic color: Clear  Labs: Lab Results  Component Value Date   WBC 11.7 (H) 12/29/2021   HGB 12.6 12/29/2021   HCT 39.9 12/29/2021   MCV 80.9 12/29/2021   PLT 198 12/29/2021    Assessment / Plan: Spontaneous labor, progressing normally  Labor: Progressing normally Preeclampsia:   Labs collect Vitals:   12/29/21 0954 12/29/21 1138 12/29/21 1143 12/29/21 1146  BP: (!) 140/97 (!) 154/96 (!) 155/91 (!) 137/99   12/29/21 1150 12/29/21 1200 12/29/21 1230 12/29/21 1245  BP: 111/86 124/83 113/80 (!) 110/92    Fetal Wellbeing:  Category I Pain Control:  Epidural I/D:   GBS positive, ABX x 1 dose, Afebrile, SROM x 6hrs Anticipated MOD:  NSVD  02/28/22, CNM 12/29/2021, 12:51 PM

## 2021-12-29 NOTE — Anesthesia Preprocedure Evaluation (Signed)
Anesthesia Evaluation  Patient identified by MRN, date of birth, ID band Patient awake    Reviewed: Allergy & Precautions, NPO status , Patient's Chart, lab work & pertinent test results  Airway Mallampati: III  TM Distance: >3 FB Neck ROM: full    Dental  (+) Chipped   Pulmonary neg pulmonary ROS, Current Smoker,    Pulmonary exam normal        Cardiovascular Exercise Tolerance: Good negative cardio ROS Normal cardiovascular exam     Neuro/Psych PSYCHIATRIC DISORDERS Depression    GI/Hepatic negative GI ROS,   Endo/Other    Renal/GU   negative genitourinary   Musculoskeletal   Abdominal   Peds  Hematology negative hematology ROS (+)   Anesthesia Other Findings Vaping HX of Substance Abuse Anemia  Past Medical History: No date: Depression No date: Prediabetes  History reviewed. No pertinent surgical history.  BMI    Body Mass Index: 33.73 kg/m      Reproductive/Obstetrics (+) Pregnancy                             Anesthesia Physical Anesthesia Plan  ASA: 2  Anesthesia Plan: Epidural   Post-op Pain Management:    Induction:   PONV Risk Score and Plan:   Airway Management Planned:   Additional Equipment:   Intra-op Plan:   Post-operative Plan:   Informed Consent: I have reviewed the patients History and Physical, chart, labs and discussed the procedure including the risks, benefits and alternatives for the proposed anesthesia with the patient or authorized representative who has indicated his/her understanding and acceptance.       Plan Discussed with: Anesthesiologist  Anesthesia Plan Comments:         Anesthesia Quick Evaluation

## 2021-12-29 NOTE — Lactation Note (Addendum)
This note was copied from a baby's chart. Lactation Consultation Note  Patient Name: Eileen Dudley Today's Date: 12/29/2021 Reason for consult: L&D Initial assessment;Primapara;1st time breastfeeding;Term Age:33 hours  Maternal Data Does the patient have breastfeeding experience prior to this delivery?: No Mom has hx of daily alcohol use while pregnant?, noted in hx, recommend discussion with mom about risks to baby if she continues this after birth, not appropriate  time at present , Also has a hx of vaping e-cig Feeding Mother's Current Feeding Choice: Breast Milk BAby was feeding on right breast in football hold when I saw mom and baby, baby actively nursing without stimulation, noted occ swallow, transition nurse, Huntley Dec, states that baby latch to left breast right after delivery before placenta was delivered and has been nursing since [facility] Score Latch: Grasps breast easily, tongue down, lips flanged, rhythmical sucking.  Audible Swallowing: A few with stimulation  Type of Nipple: Everted at rest and after stimulation  Comfort (Breast/Nipple): Soft / non-tender  Hold (Positioning): Assistance needed to correctly position infant at breast and maintain latch.  LATCH Score: 8   Lactation Tools Discussed/Used    Interventions Interventions: Breast feeding basics reviewed;Skin to skin  Discharge    Consult Status Consult Status: Follow-up from L&D    Dyann Kief 12/29/2021, 5:30 PM

## 2021-12-29 NOTE — Discharge Summary (Cosign Needed)
Obstetrical Discharge Summary  Patient Name: Eileen Dudley DOB: 13-Feb-1991 MRN: [id number]  Date of Admission: 12/29/2021 Date of Delivery: 12/29/21 Delivered by: Annamary Rummage CNM Date of Discharge: 01/10/2022  Primary OB: [facility]  [id number]'G last menstrual period was 01/03/2021 (approximate). EDC Estimated Date of Delivery: 01/02/22 Gestational Age at Delivery: [redacted]w[redacted]d   Antepartum complications:  1. GBS positive by urine 2. Vaping 3. HX of Substance Abuse 4. Anemia  Admitting Diagnosis: Active labor  Secondary Diagnosis: Patient Active Problem List   Diagnosis Date Noted   NSVD (normal spontaneous vaginal delivery) 12/29/2021   Sepsis (HCC) 01/31/2021   Severe sepsis (HCC) 01/30/2021   Pyelonephritis of left kidney 01/30/2021   Hypokalemia 01/30/2021    Augmentation: N/A Complications: None  Intrapartum complications/course:  Delivery Type: spontaneous vaginal delivery Anesthesia: epidural Placenta: spontaneous Laceration: Skid marks that did not require repair Episiotomy: none Newborn Data: Live born female  Birth Weight: 6 lb 9.8 oz (3000 g) APGAR: 7, 9  Newborn Delivery   Birth date/time: 12/29/2021 16:42:00 Delivery type: Vaginal, Spontaneous      30 y.o. G1P1001 at [redacted]w[redacted]d presenting with active labor, SROM with clear fluid.  She progressed to complete and pushed over an intact perineum and delivered the fetal head, followed promptly by the shoulders. She was in control the whole time, and the baby placed on the maternal abdomen. Delayed cord clamping and the FOB cut the baby's cord, while the baby was skin to skin. The placenta delivered spontaneously and intact. Small infraclitoral laceration that was hemostatic and did not require a stitch. Mom and baby tolerated the procedure well.   Postpartum Procedures: none  Post partum course:  Patient had an uncomplicated postpartum course.  By time of discharge on PPD#1, her pain was controlled on oral  pain medications; she had appropriate lochia and was ambulating, voiding without difficulty and tolerating regular diet.  She was deemed stable for discharge to home.    Discharge Physical Exam:  BP 122/90 (BP Location: Left Arm)   Pulse 82   Temp 98.2 F (36.8 C)   Resp 18   Ht 5\' 6"  (1.676 m)   Wt 94.8 kg   LMP 01/03/2021 (Approximate) Comment: neg preg test 01/30/21  SpO2 99%   Breastfeeding Unknown   BMI 33.73 kg/m   General: alert and no distress Pulm: normal respiratory effort Lochia: appropriate Abdomen: soft, NT Uterine Fundus: firm, below umbilicus Perineum: min swelling Extremities: No evidence of DVT seen on physical exam. No lower extremity edema. Edinburgh:     12/30/2021    2:49 PM  Edinburgh Postnatal Depression Scale Screening Tool  I have been able to laugh and see the funny side of things. 0  I have looked forward with enjoyment to things. 0  I have blamed myself unnecessarily when things went wrong. 0  I have been anxious or worried for no good reason. 1  I have felt scared or panicky for no good reason. 1  Things have been getting on top of me. 2  I have been so unhappy that I have had difficulty sleeping. 0  I have felt sad or miserable. 1  I have been so unhappy that I have been crying. 0  The thought of harming myself has occurred to me. 0  Edinburgh Postnatal Depression Scale Total 5     Labs:    Latest Ref Rng & Units 12/30/2021    5:28 AM 12/29/2021   10:56 AM 02/02/2021    5:01 AM  CBC  WBC 4.0 - 10.5 K/uL 13.9  11.7  11.7   Hemoglobin 12.0 - 15.0 g/dL 17.4  08.1  9.5   Hematocrit 36.0 - 46.0 % 34.2  39.9  28.5   Platelets 150 - 400 K/uL 169  198  427    O POS Performed at [facility], [address] [place], [place] [address]  Hemoglobin  Date Value Ref Range Status  12/30/2021 10.9 (L) 12.0 - 15.0 g/dL Final   HCT  Date Value Ref Range Status  12/30/2021 34.2 (L) 36.0 - 46.0 % Final    Disposition: stable, discharge  to home Baby Feeding: breastmilk Baby Disposition: home with mom  Contraception: TBD  Prenatal Labs:  Blood type/Rh O pos  Antibody screen neg  Rubella Immune  Varicella Immune  RPR NR  HBsAg Neg  HIV NR  GC neg  Chlamydia neg  Genetic screening negative  1 hour GTT 91  3 hour GTT    GBS Pos by urine   Rh Immune globulin given: n/a Rubella vaccine given: Immune Varicella vaccine given: Immune Tdap vaccine given in AP or PP setting: given 10/17/21 Flu vaccine given in AP or PP setting: not in season  Plan: Eileen Dudley was discharged to home in good condition.   Discharge Instructions: Per After Visit Summary. Activity: Advance as tolerated. Pelvic rest for 6 weeks.   Diet: Regular Discharge Medications: Allergies as of 12/30/2021   No Known Allergies      Medication List     STOP taking these medications    norethindrone-ethinyl estradiol 1-20 MG-MCG tablet Commonly known as: LOESTRIN       TAKE these medications    escitalopram 20 MG tablet Commonly known as: LEXAPRO Take 20 mg by mouth daily.   nicotine 14 mg/24hr patch Commonly known as: NICODERM CQ - dosed in mg/24 hours Place 1 patch (14 mg total) onto the skin daily.       Outpatient follow up:   Follow-up Information     Haroldine Laws, CNM. Schedule an appointment as soon as possible for a visit in 6 week(s).   Specialty: Certified Nurse Midwife Contact information: [address] [place] [address] [phone]                 Signed: Cyril Mourning  01/10/2022 12:33 PM

## 2021-12-29 NOTE — Anesthesia Procedure Notes (Signed)
Epidural Patient location during procedure: OB Start time: 12/29/2021 11:26 AM End time: 12/29/2021 11:44 AM  Staffing Anesthesiologist: Iran Ouch, MD Performed: anesthesiologist   Preanesthetic Checklist Completed: patient identified, IV checked, site marked, risks and benefits discussed, surgical consent, monitors and equipment checked, pre-op evaluation and timeout performed  Epidural Patient position: sitting Prep: ChloraPrep Patient monitoring: heart rate, continuous pulse ox and blood pressure Approach: midline Location: L3-L4 Injection technique: LOR saline  Needle:  Needle type: Tuohy  Needle gauge: 18 G Needle length: 9 cm Needle insertion depth: 7 cm Catheter type: closed end Catheter size: 20 Guage Catheter at skin depth: 11 cm Test dose: negative and 1.5% lidocaine with Epi 1:200 K  Assessment Events: blood not aspirated, injection not painful, no injection resistance and no paresthesia  Additional Notes Reason for block:procedure for pain

## 2021-12-29 NOTE — OB Triage Note (Signed)
Pt presents to L/D triage with reported contractions and leaking of clear fluid that began at 0630 this morning. Pt rates pain 9/10. She reports positive fetal movement and no bleeding.  Monitors applied and assessing- initial FHT 130 VSS.

## 2021-12-29 NOTE — H&P (Signed)
OB History & Physical   History of Present Illness:  Chief Complaint:   HPI:  Eileen Dudley is a 31 y.o. G1P0 female at [redacted]w[redacted]d dated by LMP.  She presents to L&D for active labor and leaking fluids  She reports:  -active fetal movement -LOF/SROM at 0630 -bloody show SVE -onset of contractions at 0630 currently every 3 minutes  Pregnancy Issues: 1. GBS positive by urine 2. Vaping 3. HX of Substance Abuse 4. Anemia   Maternal Medical History:   Past Medical History:  Diagnosis Date   Depression    Prediabetes     History reviewed. No pertinent surgical history.  No Known Allergies  Prior to Admission medications   Medication Sig Start Date End Date Taking? Authorizing Provider  escitalopram (LEXAPRO) 20 MG tablet Take 20 mg by mouth daily.   Yes [provider]  norethindrone-ethinyl estradiol (LOESTRIN) 1-20 MG-MCG tablet Take 1 tablet by mouth daily. Patient not taking: Reported on 01/30/2021 01/15/21   [provider]     Prenatal care site: [facility]    Social History: She  reports that she has been smoking e-cigarettes. She has never used smokeless tobacco. She reports current alcohol use of about 8.0 standard drinks of alcohol per week. She reports that she does not use drugs.  Family History: family history includes Pulmonary fibrosis in her father.   Review of Systems: A full review of systems was performed and negative except as noted in the HPI.    Physical Exam:  Vital Signs: BP (!) 140/97 (BP Location: Right Arm)   Pulse 93   Temp 98.3 F (36.8 C) (Oral)   Resp 20   Ht 5\' 6"  (1.676 m)   Wt 94.8 kg   LMP 01/03/2021 (Approximate) Comment: neg preg test 01/30/21  BMI 33.73 kg/m   General:   alert and cooperative  Skin:  normal  Neurologic:    Alert & oriented x 3  Lungs:    Nl effort  Heart:   regular rate and rhythm  Abdomen:  soft, non-tender; bowel sounds normal; no masses,  no organomegaly  Extremities: :  non-tender, symmetric, no edema bilaterally.      EFW: 12/21/21: 3277 g= 70 %  Results for orders placed or performed during the hospital encounter of 12/29/21 (from the past 24 hour(s))  ROM Plus ([facility] only)     Status: None   Collection Time: 12/29/21  9:45 AM  Result Value Ref Range   Rom Plus POSITIVE   CBC     Status: Abnormal   Collection Time: 12/29/21 10:56 AM  Result Value Ref Range   WBC 11.7 (H) 4.0 - 10.5 K/uL   RBC 4.93 3.87 - 5.11 MIL/uL   Hemoglobin 12.6 12.0 - 15.0 g/dL   HCT 02/28/22 55.7 - 32.2 %   MCV 80.9 80.0 - 100.0 fL   MCH 25.6 (L) 26.0 - 34.0 pg   MCHC 31.6 30.0 - 36.0 g/dL   RDW 02.5 (H) 42.7 - 06.2 %   Platelets 198 150 - 400 K/uL   nRBC 0.0 0.0 - 0.2 %  Type and screen [facility]     Status: None (Preliminary result)   Collection Time: 12/29/21 10:56 AM  Result Value Ref Range   ABO/RH(D) PENDING    Antibody Screen PENDING    Sample Expiration      01/01/2022,2359 Performed at [facility], [address], [place], [place]     Pertinent  Results:  Prenatal Labs: Blood type/Rh O pos  Antibody screen neg  Rubella Immune  Varicella Immune  RPR NR  HBsAg Neg  HIV NR  GC neg  Chlamydia neg  Genetic screening negative  1 hour GTT 91  3 hour GTT   GBS Pos by urine   FHT: FHR: 120 bpm, variability: moderate,  accelerations:  Present,  decelerations:  Present occasional variables Category/reactivity:  Category I TOCO: regular, every 3 minutes SVE: Dilation: 3.5 / Effacement (%): 80 / Station: 0     Assessment:  [name] is a 31 y.o. G1P0 female at [redacted]w[redacted]d with active labor.   Plan:  1. Admit to Labor & Delivery; consents reviewed and obtained  2. Fetal Well being  - Fetal Tracing: Cat I - GBS pos - Presentation: VTX confirmed by SVE   3. Routine OB: - Prenatal labs reviewed, as above - Rh pos - CBC & T&S on admit - Clear fluids, IVF  4. Monitoring of Labor -  Contractions by external  toco in place -  Plan for continuous fetal monitoring  -  Maternal pain control as desired: IVPM, nitrous, regional anesthesia - Anticipate vaginal delivery  5. Post Partum Planning: - Infant feeding: Breast - Contraception: POPs - Tdap given 10/17/21  Haroldine Laws, CNM 12/29/2021 11:19 AM

## 2021-12-30 LAB — CBC
HCT: 34.2 % — ABNORMAL LOW (ref 36.0–46.0)
Hemoglobin: 10.9 g/dL — ABNORMAL LOW (ref 12.0–15.0)
MCH: 26.1 pg (ref 26.0–34.0)
MCHC: 31.9 g/dL (ref 30.0–36.0)
MCV: 81.8 fL (ref 80.0–100.0)
Platelets: 169 10*3/uL (ref 150–400)
RBC: 4.18 MIL/uL (ref 3.87–5.11)
RDW: 17.7 % — ABNORMAL HIGH (ref 11.5–15.5)
WBC: 13.9 10*3/uL — ABNORMAL HIGH (ref 4.0–10.5)
nRBC: 0 % (ref 0.0–0.2)

## 2021-12-30 LAB — RPR: RPR Ser Ql: NONREACTIVE

## 2021-12-30 MED ORDER — NICOTINE 14 MG/24HR TD PT24: 14.0000 mg | MEDICATED_PATCH | Freq: Every day | TRANSDERMAL | 0 refills | Status: AC

## 2021-12-30 MED ORDER — NICOTINE 14 MG/24HR TD PT24
14.0000 mg | MEDICATED_PATCH | Freq: Every day | TRANSDERMAL | Status: DC
  Administered 2021-12-30: 14 mg via TRANSDERMAL
  Filled 2021-12-30: qty 1

## 2021-12-30 NOTE — Lactation Note (Signed)
This note was copied from a baby's chart. Lactation Consultation Note  Patient Name: Girl Coralyn Roselli [id number] Date: 12/30/2021 Reason for consult: Follow-up assessment;Primapara;Term Age:33 hours  Maternal Data Has patient been taught Hand Expression?: Yes Does the patient have breastfeeding experience prior to this delivery?: No  Feeding Mother's Current Feeding Choice: Breast Milk  Lactation at bedside for feeding support post bath.  LATCH Score Latch: Repeated attempts needed to sustain latch, nipple held in mouth throughout feeding, stimulation needed to elicit sucking reflex.  Audible Swallowing: A few with stimulation  Type of Nipple: Everted at rest and after stimulation  Comfort (Breast/Nipple): Soft / non-tender  Hold (Positioning): Assistance needed to correctly position infant at breast and maintain latch.  LATCH Score: 7   LC assisted and supported mom through different positions and worked with latching deeply. It took a few attempts for LC to obtain deep latch, and then when baby would come off or slip to nipple mom practiced relatching with LC at bedside. Mom did well, and got better with the more attempts. Swallows identified and pointed out to mom.  Lactation Tools Discussed/Used    Interventions Interventions: Breast feeding basics reviewed;Assisted with latch;Hand express;Adjust position;Support pillows;Position options;Education  Tips/strategies for latching, breast compression/massage to increase transfer, identifying swallows and when baby has had enough.  Discharge    Consult Status Consult Status: Follow-up Date: 12/30/21 Follow-up type: In-patient    Danford Bad 12/30/2021, 2:33 PM

## 2021-12-30 NOTE — Lactation Note (Signed)
This note was copied from a baby's chart. Lactation Consultation Note  Patient Name: Eileen Dudley [id number] Date: 12/30/2021 Reason for consult: Follow-up assessment;Primapara;Term Age:33 hours  Maternal Data Has patient been taught Hand Expression?: Yes Does the patient have breastfeeding experience prior to this delivery?: No  Feeding Mother's Current Feeding Choice: Breast Milk  Baby initially had been feeding well, but parents report baby feeding or wanting to be at the breast frequently overnight. Baby active at breast upon entry however baby appeared to be pacifying and not nutritive sucking at the breast. LC unlatched baby and nipple was flat/pinched; explained the latch and suckles to parents.  LATCH Score Latch: Repeated attempts needed to sustain latch, nipple held in mouth throughout feeding, stimulation needed to elicit sucking reflex.  Audible Swallowing: A few with stimulation  Type of Nipple: Everted at rest and after stimulation  Comfort (Breast/Nipple): Soft / non-tender  Hold (Positioning): Assistance needed to correctly position infant at breast and maintain latch.  LATCH Score: 7   Lactation Tools Discussed/Used Tools: Coconut oil;Comfort gels  Interventions Interventions: Breast feeding basics reviewed;Hand express (Deep latch, round nipple vs flat nipple, pacifiying suckles vs nutritive feeding)  Reviewed early cues, true feeding cues vs comfort cues, achieving a deep latch, impact of shallow latch on nipple and milk transfer. Tips given on how to achieve a deep latch, positioning and alignment.  Discharge    Consult Status Consult Status: Follow-up Date: 12/30/21 Follow-up type: In-patient  Whiteboard updated; parents to call with next feeding attempt. Encouraged to snuggle baby, skin to skin, rest and apply coconut oil.  Danford Bad 12/30/2021, 10:06 AM

## 2021-12-30 NOTE — Lactation Note (Signed)
This note was copied from a baby's chart. Lactation Consultation Note  Patient Name: Eileen Dudley [id number] Date: 12/30/2021 Reason for consult: Follow-up assessment;Mother's request;Primapara;Term Age:33 hours  Maternal Data Has patient been taught Hand Expression?: Yes Does the patient have breastfeeding experience prior to this delivery?: No  Mom is desiring 24hr discharge. Feeling more confident in positioning and latching baby after working with lactation today.  Feeding Mother's Current Feeding Choice: Breast Milk  LC at bedside observing baby going to the breast.  LATCH Score Latch: Repeated attempts needed to sustain latch, nipple held in mouth throughout feeding, stimulation needed to elicit sucking reflex.  Audible Swallowing: A few with stimulation  Type of Nipple: Everted at rest and after stimulation  Comfort (Breast/Nipple): Soft / non-tender  Hold (Positioning): No assistance needed to correctly position infant at breast.  LATCH Score: 8  Mom had baby in cross-cradle hold at the L breast. LC verbally walked mom through signs of wide mouth and bringing baby in quickly. Mom worked through it and got Land independently with sandwich hold and breast compressions throughout the feed; audible swallows heard.  Lactation Tools Discussed/Used    Interventions Interventions: Breast feeding basics reviewed;Support pillows;Education;Ice;Pre-pump if needed (PP/Newborn booklet)  We reviewed BF expectations for the week ahead. 8-12 feedings/24 hours, output expectations, cluster feeding/growth spurts, early cues, milk supply and demand, and normal course of lactation. Guided mom towards the PP/newborn booklet for references and further education.  Discharge Discharge Education: Engorgement and breast care;Warning signs for feeding baby;Outpatient recommendation Pump: Personal  Guidance given for anticipated breast changes and management of breast fullness  and nipple care. Discussed possible need to pre-pump to soften the breast tissue for deep latch through hand expression or hand pump, or 2-13minutes of EBP. Guidance given for use of ice for swelling and continued intake of ibuprofen.  Consult Status Consult Status: Complete Date: 12/30/21 Follow-up type: In-patient  Outpatient lactation service information given. Encouraged mom to reach out with questions/concerns and for ongoing BF support as needed.  Danford Bad 12/30/2021, 4:50 PM

## 2021-12-30 NOTE — Progress Notes (Signed)
DC instructions given to pt. No concerns expressed and all questions answered. No iv present. Pt accompanied by FOB. To call out when ready to leave.

## 2021-12-30 NOTE — TOC Initial Note (Signed)
Transition of Care [facility]) - Initial/Assessment Note    Patient Details  Name: Eileen Dudley MRN: [id number] Date of Birth: [birth year]  Transition of Care [facility]) CM/SW Contact:    [address], [place] Phone Number: 12/30/2021, 9:44 AM  Clinical Narrative:                  CSW spoke with pt via phone. Pt had questions regarding insurance coverage for new baby. CSW recommended pt reach out to her HR department to get clarification on coverage as it appears this situation is unique and CSW didn't want to provide incorrect information. Pt appreciative and states she will reach out to her HR department tomorrow. CSW reminded pt that most companies require enrollment of new dependents within 30 days.        Patient Goals and CMS Choice        Expected Discharge Plan and Services                                                Prior Living Arrangements/Services                       Activities of Daily Living Home Assistive Devices/Equipment: None ADL Screening (condition at time of admission) Patient's cognitive ability adequate to safely complete daily activities?: Yes Is the patient deaf or have difficulty hearing?: No Does the patient have difficulty seeing, even when wearing glasses/contacts?: No Does the patient have difficulty concentrating, remembering, or making decisions?: No Patient able to express need for assistance with ADLs?: Yes Does the patient have difficulty dressing or bathing?: No Independently performs ADLs?: Yes (appropriate for developmental age) Does the patient have difficulty walking or climbing stairs?: No Weakness of Legs: None Weakness of Arms/Hands: None  Permission Sought/Granted                  Emotional Assessment              Admission diagnosis:  Amniotic fluid leaking [O42.90] Normal labor [O80, Z37.9] Patient Active Problem List   Diagnosis Date Noted   NSVD (normal spontaneous vaginal delivery)  12/29/2021   Sepsis ([place]) 01/31/2021   Severe sepsis ([place]) 01/30/2021   Pyelonephritis of left kidney 01/30/2021   Hypokalemia 01/30/2021   PCP:  Patient, No Pcp Per (Inactive) Pharmacy:   [facility] [place], [place] [address] AT [address] [address] Phone: [phone] Fax: [phone]     Social Determinants of Health (SDOH) Interventions    Readmission Risk Interventions     No data to display

## 2021-12-30 NOTE — Anesthesia Postprocedure Evaluation (Signed)
Anesthesia Post Note  Patient: Eileen Dudley  Procedure(s) Performed: AN AD HOC LABOR EPIDURAL  Patient location during evaluation: Mother Baby Anesthesia Type: Epidural Level of consciousness: awake and alert Pain management: pain level controlled Vital Signs Assessment: post-procedure vital signs reviewed and stable Respiratory status: spontaneous breathing, nonlabored ventilation and respiratory function stable Cardiovascular status: blood pressure returned to baseline and stable Postop Assessment: no headache, no backache, no apparent nausea or vomiting, adequate PO intake and able to ambulate Anesthetic complications: no   No notable events documented.   Last Vitals:  Vitals:   12/29/21 2227 12/30/21 0029  BP: (!) 130/92 128/89  Pulse: 68 73  Resp: 20 20  Temp: 36.9 C 36.6 C  SpO2: 99% 98%    Last Pain:  Vitals:   12/30/21 0738  TempSrc:   PainSc: 2                  Foye Deer

## 2022-02-26 ENCOUNTER — Other Ambulatory Visit: Payer: Self-pay

## 2022-02-26 ENCOUNTER — Encounter: Payer: Self-pay | Admitting: *Deleted

## 2022-02-26 DIAGNOSIS — M545 Low back pain, unspecified: Secondary | ICD-10-CM | POA: Diagnosis not present

## 2022-02-26 DIAGNOSIS — Z5321 Procedure and treatment not carried out due to patient leaving prior to being seen by health care provider: Secondary | ICD-10-CM | POA: Insufficient documentation

## 2022-02-26 DIAGNOSIS — R300 Dysuria: Secondary | ICD-10-CM | POA: Insufficient documentation

## 2022-02-26 LAB — CBC
HCT: 36.3 % (ref 36.0–46.0)
Hemoglobin: 11.2 g/dL — ABNORMAL LOW (ref 12.0–15.0)
MCH: 27 pg (ref 26.0–34.0)
MCHC: 30.9 g/dL (ref 30.0–36.0)
MCV: 87.5 fL (ref 80.0–100.0)
Platelets: 311 10*3/uL (ref 150–400)
RBC: 4.15 MIL/uL (ref 3.87–5.11)
RDW: 17.9 % — ABNORMAL HIGH (ref 11.5–15.5)
WBC: 8.5 10*3/uL (ref 4.0–10.5)
nRBC: 0 % (ref 0.0–0.2)

## 2022-02-26 LAB — POC URINE PREG, ED: Preg Test, Ur: NEGATIVE

## 2022-02-26 LAB — COMPREHENSIVE METABOLIC PANEL
ALT: 18 U/L (ref 0–44)
AST: 21 U/L (ref 15–41)
Albumin: 3.3 g/dL — ABNORMAL LOW (ref 3.5–5.0)
Alkaline Phosphatase: 91 U/L (ref 38–126)
Anion gap: 7 (ref 5–15)
BUN: 5 mg/dL — ABNORMAL LOW (ref 6–20)
CO2: 27 mmol/L (ref 22–32)
Calcium: 8.4 mg/dL — ABNORMAL LOW (ref 8.9–10.3)
Chloride: 106 mmol/L (ref 98–111)
Creatinine, Ser: 0.92 mg/dL (ref 0.44–1.00)
GFR, Estimated: >60 mL/min (ref >60)
Glucose, Bld: 123 mg/dL — ABNORMAL HIGH (ref 70–99)
Potassium: 3.8 mmol/L (ref 3.5–5.1)
Sodium: 140 mmol/L (ref 135–145)
Total Bilirubin: 0.4 mg/dL (ref 0.3–1.2)
Total Protein: 6.6 g/dL (ref 6.5–8.1)

## 2022-02-26 LAB — URINALYSIS, ROUTINE W REFLEX MICROSCOPIC
Bilirubin Urine: NEGATIVE
Glucose, UA: NEGATIVE mg/dL
Ketones, ur: NEGATIVE mg/dL
Nitrite: NEGATIVE
Protein, ur: 30 mg/dL — AB
Specific Gravity, Urine: 1.004 — ABNORMAL LOW (ref 1.005–1.030)
WBC, UA: >50 WBC/hpf — ABNORMAL HIGH (ref 0–5)
pH: 6 (ref 5.0–8.0)

## 2022-02-26 NOTE — ED Triage Notes (Signed)
Pt has dysuria and lower back pain.  No n/v/  chills and fever today  hx uti's  pt alert  speech clear.

## 2022-02-27 ENCOUNTER — Emergency Department
Admission: EM | Admit: 2022-02-27 | Discharge: 2022-02-27 | Discharge status: Against Medical Advice | Payer: BC Managed Care – PPO | Attending: Emergency Medicine | Admitting: Emergency Medicine

## 2022-02-27 NOTE — ED Notes (Signed)
No answer when called several times from lobby 

## 2022-03-01 LAB — URINE CULTURE: Culture: 20000 — AB

## 2022-12-07 IMAGING — DX DG CHEST 1V PORT
1 series · 1 of 1 positions shown · non-contrast
Comparison: None.

CLINICAL DATA: Questionable sepsis

EXAM:
PORTABLE CHEST 1 VIEW

[chest ap]
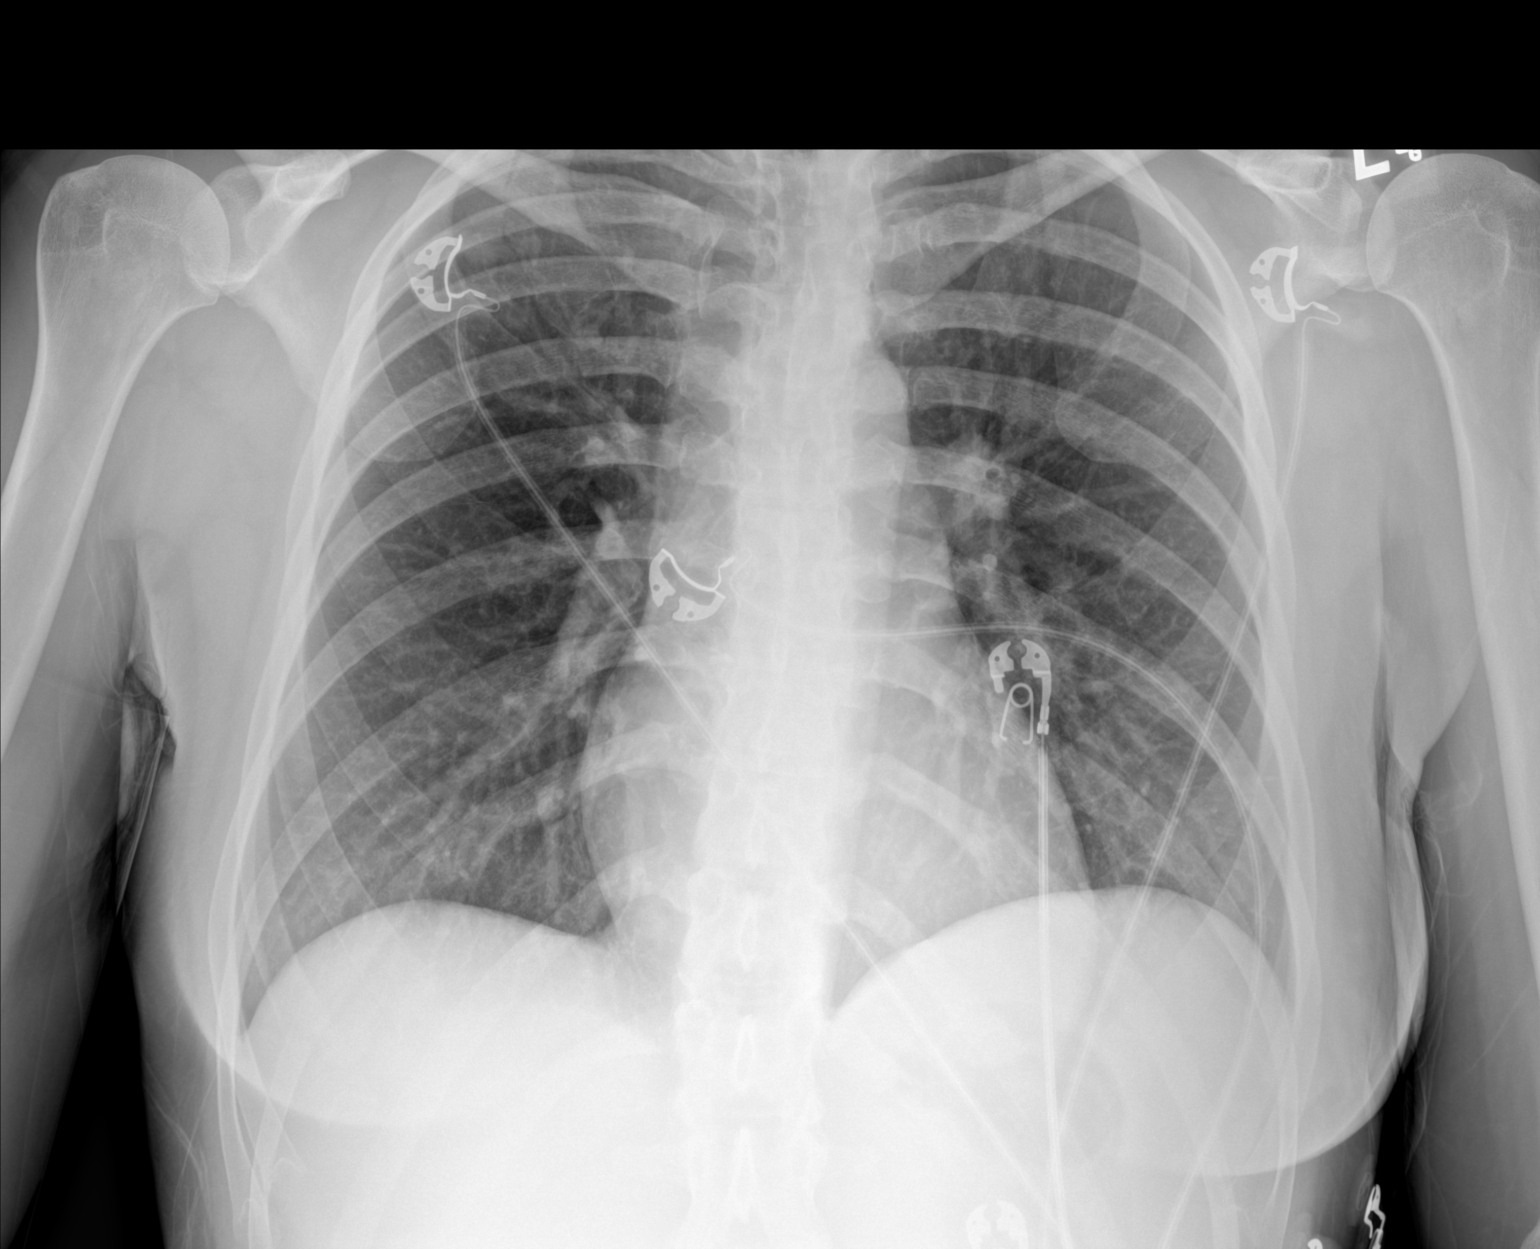

[1 of 1 positions shown; findings below may reference images not displayed]

FINDINGS: The heart size and mediastinal contours are within normal limits.
Both lungs are clear. The visualized skeletal structures are
unremarkable.
IMPRESSION: No active disease.

## 2023-07-23 NOTE — L&D Delivery Note (Addendum)
 Delivery Note  33yo [id number] at [redacted]w[redacted]d presented in active labor, and progressed quickly with an epidural to fully dilated. Baby's head delivered with a tight nuchal cord and delivered through it, disentangled and placed on material abdomen for peds to care for.  Planning breast, vasectomy  At 5:58 PM a viable and healthy female was delivered via direct OA. APGAR: 6/9 ; weight pending.   Placenta status: intact spontaneous Cord: 3VC  with the following complications:  tight nuchal.  Cord pH: pending  Anesthesia: Epidural Episiotomy:  none Lacerations:  none Est. Blood Loss (mL):  300  Mom to postpartum.  Baby to Couplet care / Skin to Skin.  [facility]HD patient  Heather Penton 02/01/2024, 6:23 PM

## 2023-09-29 ENCOUNTER — Telehealth

## 2023-09-30 NOTE — Progress Notes (Signed)
 This encounter was created in error - please disregard.

## 2023-10-20 ENCOUNTER — Ambulatory Visit (LOCAL_COMMUNITY_HEALTH_CENTER): Payer: Self-pay

## 2023-10-20 ENCOUNTER — Encounter: Payer: Self-pay | Admitting: Family Medicine

## 2023-10-20 VITALS — BP 106/74 | Ht 66.0 in | Wt 201.5 lb

## 2023-10-20 DIAGNOSIS — H20829 Vogt-Koyanagi syndrome, unspecified eye: Secondary | ICD-10-CM | POA: Insufficient documentation

## 2023-10-20 DIAGNOSIS — Z3201 Encounter for pregnancy test, result positive: Secondary | ICD-10-CM

## 2023-10-20 DIAGNOSIS — Z3009 Encounter for other general counseling and advice on contraception: Secondary | ICD-10-CM

## 2023-10-20 LAB — PREGNANCY, URINE: Preg Test, Ur: POSITIVE — AB

## 2023-10-20 MED ORDER — PRENATAL 27-0.8 MG PO TABS: 1.0000 | ORAL_TABLET | Freq: Every day | ORAL | Status: AC

## 2023-10-20 NOTE — Progress Notes (Signed)
 UPT positive. Positive preg packet given and reviewed.   Hx depression and post partum depression (2023). PHQ = 11 today. Denies thoughts of harming self and others.  Patient states she has "situational stress". Interested in behavioral health resources which were given to patient today. Contact info for [facility] 800 #, and [facility] urgent care info given and explained.   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 States she has received treatment with Humira at [facility] years ago.  Per patient, did not continue f-u with provider for  health issues and past depression d/t loss of insurance coverage.   Consult Dr Lorrin Mais and informed of patient status and hx. Ok to start prenatal care at [facility] and if it is determined patient is high risk and needs additional monitoring, patient will be transferred to a high risk provider.   RN explained provider recommendation and patient is in agreement and would like to make new OB appt.   Patient sent to clerk for presumptive eligibility medicaid/preg women and new OB appt. Jerel Shepherd, RN

## 2023-10-21 NOTE — Progress Notes (Signed)
 Attestation of Attending Supervision of RN:  I was consulted regarding this patient case and agree with the documentation below.   Fayette Pho, MD Clinical Services Medical Director [facility] Department 10/21/23  11:41 AM

## 2023-11-03 ENCOUNTER — Ambulatory Visit: Payer: Self-pay | Admitting: Family Medicine

## 2023-11-03 ENCOUNTER — Encounter: Payer: Self-pay | Admitting: Family Medicine

## 2023-11-03 VITALS — BP 102/70 | HR 96 | Temp 97.2°F | Ht 66.5 in | Wt 204.6 lb

## 2023-11-03 DIAGNOSIS — Z3483 Encounter for supervision of other normal pregnancy, third trimester: Secondary | ICD-10-CM | POA: Diagnosis not present

## 2023-11-03 DIAGNOSIS — Z87891 Personal history of nicotine dependence: Secondary | ICD-10-CM

## 2023-11-03 DIAGNOSIS — Z23 Encounter for immunization: Secondary | ICD-10-CM | POA: Diagnosis not present

## 2023-11-03 DIAGNOSIS — O99213 Obesity complicating pregnancy, third trimester: Secondary | ICD-10-CM

## 2023-11-03 DIAGNOSIS — O0930 Supervision of pregnancy with insufficient antenatal care, unspecified trimester: Secondary | ICD-10-CM

## 2023-11-03 DIAGNOSIS — Z3480 Encounter for supervision of other normal pregnancy, unspecified trimester: Secondary | ICD-10-CM

## 2023-11-03 DIAGNOSIS — F32A Depression, unspecified: Secondary | ICD-10-CM

## 2023-11-03 DIAGNOSIS — O09529 Supervision of elderly multigravida, unspecified trimester: Secondary | ICD-10-CM

## 2023-11-03 DIAGNOSIS — Z3A27 27 weeks gestation of pregnancy: Secondary | ICD-10-CM

## 2023-11-03 DIAGNOSIS — H20829 Vogt-Koyanagi syndrome, unspecified eye: Secondary | ICD-10-CM

## 2023-11-03 DIAGNOSIS — F419 Anxiety disorder, unspecified: Secondary | ICD-10-CM | POA: Insufficient documentation

## 2023-11-03 DIAGNOSIS — Z87898 Personal history of other specified conditions: Secondary | ICD-10-CM

## 2023-11-03 DIAGNOSIS — O0933 Supervision of pregnancy with insufficient antenatal care, third trimester: Secondary | ICD-10-CM

## 2023-11-03 DIAGNOSIS — O99210 Obesity complicating pregnancy, unspecified trimester: Secondary | ICD-10-CM

## 2023-11-03 HISTORY — DX: Supervision of elderly multigravida, unspecified trimester: O09.529

## 2023-11-03 HISTORY — DX: Supervision of pregnancy with insufficient antenatal care, unspecified trimester: O09.30

## 2023-11-03 LAB — WET PREP FOR TRICH, YEAST, CLUE
Trichomonas Exam: NEGATIVE
Yeast Exam: NEGATIVE

## 2023-11-03 LAB — HEMOGLOBIN, FINGERSTICK: Hemoglobin: 11.7 g/dL (ref 11.1–15.9)

## 2023-11-03 MED ORDER — ESCITALOPRAM OXALATE 10 MG PO TABS: 10.0000 mg | ORAL_TABLET | Freq: Every day | ORAL | 4 refills | Status: DC

## 2023-11-03 MED ORDER — ASPIRIN 81 MG PO TBEC: 81.0000 mg | DELAYED_RELEASE_TABLET | Freq: Every day | ORAL | Status: DC
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
## 2023-11-03 NOTE — Progress Notes (Addendum)
 Behavioral Health Consent signed. Patient given copy. [facility] Social Work Museum/gallery curator given. Patient states most likely had postpartum depression with previous child.  The patient was dispensed Baby Aspirin  today. I provided counseling today regarding the medication. We discussed the medication, the side effects and when to call clinic. Patient given the opportunity to ask questions. Questions answered.  TDAP VIS given. Declined Flu and Covid vaccine during pregnancy.  In house lab results reviewed during visit. Rx prescription Card given. Faxed U/S order to [facility].   Gery Kubas RN

## 2023-11-04 ENCOUNTER — Telehealth: Payer: Self-pay

## 2023-11-04 LAB — PROTEIN / CREATININE RATIO, URINE
Creatinine, Urine: 65.2 mg/dL
Protein, Ur: 10.3 mg/dL
Protein/Creat Ratio: 158 mg/g{creat} (ref 0–200)

## 2023-11-04 NOTE — Telephone Encounter (Signed)
 Called patient to notify of date, time and location of ultrasound: May 7 @ [address] on [address] Instructions given to arrive with full bladder. Gery Kubas RN

## 2023-11-08 LAB — HCV INTERPRETATION

## 2023-11-08 LAB — MATERNIT 21 PLUS CORE, BLOOD
Fetal Fraction: 35
Result (T21): NEGATIVE
Trisomy 13 (Patau syndrome): NEGATIVE
Trisomy 18 (Edwards syndrome): NEGATIVE
Trisomy 21 (Down syndrome): NEGATIVE

## 2023-11-08 LAB — VARICELLA ZOSTER ANTIBODY, IGG: Varicella zoster IgG: REACTIVE

## 2023-11-08 LAB — COMPREHENSIVE METABOLIC PANEL WITH GFR
ALT: 12 IU/L (ref 0–32)
AST: 13 IU/L (ref 0–40)
Albumin: 3.6 g/dL — ABNORMAL LOW (ref 3.9–4.9)
Alkaline Phosphatase: 113 IU/L (ref 44–121)
BUN/Creatinine Ratio: 15 (ref 9–23)
BUN: 8 mg/dL (ref 6–20)
Bilirubin Total: <0.2 mg/dL (ref 0.0–1.2)
CO2: 22 mmol/L (ref 20–29)
Calcium: 8.9 mg/dL (ref 8.7–10.2)
Chloride: 101 mmol/L (ref 96–106)
Creatinine, Ser: 0.54 mg/dL — ABNORMAL LOW (ref 0.57–1.00)
Globulin, Total: 2.9 g/dL (ref 1.5–4.5)
Glucose: 87 mg/dL (ref 70–99)
Potassium: 4.4 mmol/L (ref 3.5–5.2)
Sodium: 136 mmol/L (ref 134–144)
Total Protein: 6.5 g/dL (ref 6.0–8.5)
eGFR: 124 mL/min/{1.73_m2} (ref >59)

## 2023-11-08 LAB — PREGNANCY, INITIAL SCREEN
Antibody Screen: NEGATIVE
Basophils Absolute: 0 10*3/uL (ref 0.0–0.2)
Basos: 0 %
Bilirubin, UA: NEGATIVE
Chlamydia trachomatis, NAA: NEGATIVE
EOS (ABSOLUTE): 0.1 10*3/uL (ref 0.0–0.4)
Eos: 1 %
Glucose, UA: NEGATIVE
HCV Ab: NONREACTIVE
HIV Screen 4th Generation wRfx: NONREACTIVE
Hematocrit: 35 % (ref 34.0–46.6)
Hemoglobin: 11.8 g/dL (ref 11.1–15.9)
Hepatitis B Surface Ag: NEGATIVE
Immature Grans (Abs): 0 10*3/uL (ref 0.0–0.1)
Immature Granulocytes: 0 %
Ketones, UA: NEGATIVE
Leukocytes,UA: NEGATIVE
Lymphocytes Absolute: 1.9 10*3/uL (ref 0.7–3.1)
Lymphs: 19 %
MCH: 30.2 pg (ref 26.6–33.0)
MCHC: 33.7 g/dL (ref 31.5–35.7)
MCV: 90 fL (ref 79–97)
Monocytes Absolute: 0.6 10*3/uL (ref 0.1–0.9)
Monocytes: 6 %
Neisseria Gonorrhoeae by PCR: NEGATIVE
Neutrophils Absolute: 7.3 10*3/uL — ABNORMAL HIGH (ref 1.4–7.0)
Neutrophils: 74 %
Nitrite, UA: NEGATIVE
Platelets: 356 10*3/uL (ref 150–450)
Protein,UA: NEGATIVE
RBC, UA: NEGATIVE
RBC: 3.91 x10E6/uL (ref 3.77–5.28)
RDW: 12 % (ref 11.7–15.4)
RPR Ser Ql: NONREACTIVE
Rh Factor: POSITIVE
Rubella Antibodies, IGG: 1.28 {index} (ref >0.99)
Specific Gravity, UA: 1.015 (ref 1.005–1.030)
Urobilinogen, Ur: 0.2 mg/dL (ref 0.2–1.0)
WBC: 10 10*3/uL (ref 3.4–10.8)
pH, UA: 6.5 (ref 5.0–7.5)

## 2023-11-08 LAB — HGB FRACTIONATION CASCADE
Hgb A2: 2.4 % (ref 1.8–3.2)
Hgb A: 97.6 % (ref 96.4–98.8)
Hgb F: 0 % (ref 0.0–2.0)
Hgb S: 0 %

## 2023-11-08 LAB — GLUCOSE, 1 HOUR GESTATIONAL: Gestational Diabetes Screen: 92 mg/dL (ref 70–139)

## 2023-11-08 LAB — HGB A1C W/O EAG: Hgb A1c MFr Bld: 5.2 % (ref 4.8–5.6)

## 2023-11-08 LAB — MICROSCOPIC EXAMINATION
Bacteria, UA: NONE SEEN
Casts: NONE SEEN /LPF
WBC, UA: NONE SEEN /HPF (ref 0–5)

## 2023-11-08 LAB — URINE CULTURE, OB REFLEX

## 2023-11-08 LAB — TSH: TSH: 1.46 u[IU]/mL (ref 0.450–4.500)

## 2023-11-11 ENCOUNTER — Encounter: Payer: Self-pay | Admitting: Family Medicine

## 2023-11-18 ENCOUNTER — Ambulatory Visit: Payer: Self-pay

## 2023-11-24 ENCOUNTER — Telehealth: Payer: Self-pay

## 2023-11-24 NOTE — Telephone Encounter (Signed)
 Call transferred to clinic by Adell Hones in [facility] clerical due to time. Client calling to verify address of [facility] US  at [facility] facility on 11/26/23. Counseled unable to bring children to appt and may only have 1 visitor that is at least 35 years old. Ariel Begun, RN

## 2023-11-25 ENCOUNTER — Ambulatory Visit: Payer: Self-pay

## 2023-11-25 ENCOUNTER — Ambulatory Visit: Payer: Self-pay | Admitting: Nurse Practitioner

## 2023-11-25 VITALS — BP 95/72 | HR 97 | Temp 98.1°F | Wt 206.2 lb

## 2023-11-25 DIAGNOSIS — O09529 Supervision of elderly multigravida, unspecified trimester: Secondary | ICD-10-CM

## 2023-11-25 DIAGNOSIS — Z87898 Personal history of other specified conditions: Secondary | ICD-10-CM

## 2023-11-25 DIAGNOSIS — G2581 Restless legs syndrome: Secondary | ICD-10-CM | POA: Insufficient documentation

## 2023-11-25 DIAGNOSIS — O09523 Supervision of elderly multigravida, third trimester: Secondary | ICD-10-CM

## 2023-11-25 DIAGNOSIS — Z3480 Encounter for supervision of other normal pregnancy, unspecified trimester: Secondary | ICD-10-CM

## 2023-11-25 DIAGNOSIS — F32A Depression, unspecified: Secondary | ICD-10-CM

## 2023-11-25 DIAGNOSIS — O0930 Supervision of pregnancy with insufficient antenatal care, unspecified trimester: Secondary | ICD-10-CM

## 2023-11-25 DIAGNOSIS — O0933 Supervision of pregnancy with insufficient antenatal care, third trimester: Secondary | ICD-10-CM

## 2023-11-25 DIAGNOSIS — F419 Anxiety disorder, unspecified: Secondary | ICD-10-CM

## 2023-11-25 DIAGNOSIS — O99213 Obesity complicating pregnancy, third trimester: Secondary | ICD-10-CM

## 2023-11-25 DIAGNOSIS — Z3483 Encounter for supervision of other normal pregnancy, third trimester: Secondary | ICD-10-CM

## 2023-11-25 DIAGNOSIS — O99210 Obesity complicating pregnancy, unspecified trimester: Secondary | ICD-10-CM

## 2023-11-25 MED ORDER — ESCITALOPRAM OXALATE 20 MG PO TABS: 20.0000 mg | ORAL_TABLET | Freq: Every day | ORAL | 3 refills | Status: AC
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
## 2023-11-25 NOTE — Progress Notes (Signed)
 Patient here for MH RV at 30 1/7. Aware of U/S tomorrow.Rosiland Cooks, RN

## 2023-11-26 ENCOUNTER — Ambulatory Visit (HOSPITAL_BASED_OUTPATIENT_CLINIC_OR_DEPARTMENT_OTHER): Payer: Self-pay | Admitting: Maternal & Fetal Medicine

## 2023-11-26 ENCOUNTER — Ambulatory Visit: Payer: Self-pay | Attending: Family Medicine

## 2023-11-26 ENCOUNTER — Other Ambulatory Visit: Payer: Self-pay | Admitting: *Deleted

## 2023-11-26 VITALS — BP 110/70 | HR 98

## 2023-11-26 DIAGNOSIS — H20823 Vogt-Koyanagi syndrome, bilateral: Secondary | ICD-10-CM

## 2023-11-26 DIAGNOSIS — Z36 Encounter for antenatal screening for chromosomal anomalies: Secondary | ICD-10-CM | POA: Insufficient documentation

## 2023-11-26 DIAGNOSIS — O0930 Supervision of pregnancy with insufficient antenatal care, unspecified trimester: Secondary | ICD-10-CM

## 2023-11-26 DIAGNOSIS — O0933 Supervision of pregnancy with insufficient antenatal care, third trimester: Secondary | ICD-10-CM

## 2023-11-26 DIAGNOSIS — E669 Obesity, unspecified: Secondary | ICD-10-CM

## 2023-11-26 DIAGNOSIS — O99210 Obesity complicating pregnancy, unspecified trimester: Secondary | ICD-10-CM

## 2023-11-26 DIAGNOSIS — Z3689 Encounter for other specified antenatal screening: Secondary | ICD-10-CM | POA: Insufficient documentation

## 2023-11-26 DIAGNOSIS — O99891 Other specified diseases and conditions complicating pregnancy: Secondary | ICD-10-CM | POA: Diagnosis not present

## 2023-11-26 DIAGNOSIS — Z3A30 30 weeks gestation of pregnancy: Secondary | ICD-10-CM

## 2023-11-26 DIAGNOSIS — O09523 Supervision of elderly multigravida, third trimester: Secondary | ICD-10-CM

## 2023-11-26 DIAGNOSIS — Z3686 Encounter for antenatal screening for cervical length: Secondary | ICD-10-CM | POA: Insufficient documentation

## 2023-11-26 DIAGNOSIS — O09529 Supervision of elderly multigravida, unspecified trimester: Secondary | ICD-10-CM

## 2023-11-26 DIAGNOSIS — O99213 Obesity complicating pregnancy, third trimester: Secondary | ICD-10-CM | POA: Diagnosis not present

## 2023-11-26 DIAGNOSIS — Z3480 Encounter for supervision of other normal pregnancy, unspecified trimester: Secondary | ICD-10-CM
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
## 2023-11-27 ENCOUNTER — Encounter: Payer: Self-pay | Admitting: Family Medicine

## 2023-11-28 ENCOUNTER — Telehealth: Payer: Self-pay

## 2023-11-28 ENCOUNTER — Telehealth: Payer: Self-pay | Admitting: Family Medicine

## 2023-11-28 NOTE — Telephone Encounter (Signed)
 Patient called in stating prescription hadn't been sent to the pharmacy. She wanted to speak to a nurse. She said she came in for a visit on 11/25/2023 and is currently still waiting.

## 2023-11-28 NOTE — Telephone Encounter (Signed)
Encounter opened in error. Eileen Erven, RN  

## 2023-11-28 NOTE — Telephone Encounter (Signed)
 2 pharmacies listed in client's chart and per call made to [facility] in [place], they received electronic prescription for Lexipro 20mg  (generic). Per pharmacist, with discount card on file for client, cost of 30 day supply will be $42.65. Call to client who went to [facility] in [place] where script was not called into and unable to retrieve medicine there. Counseled prescription was at [facility]. Per client, medicine is cheaper at [facility] and desires prescription be sent there. Jason Mess FNP notified and requested client call [facility] and have prescription transferred to [facility]. Call to client and she agrees to do this. Ariel Begun, RN

## 2023-12-08 ENCOUNTER — Telehealth: Payer: Self-pay | Admitting: Family Medicine

## 2023-12-08 ENCOUNTER — Ambulatory Visit: Payer: Self-pay | Admitting: Licensed Clinical Social Worker

## 2023-12-08 DIAGNOSIS — F411 Generalized anxiety disorder: Secondary | ICD-10-CM

## 2023-12-08 DIAGNOSIS — F331 Major depressive disorder, recurrent, moderate: Secondary | ICD-10-CM

## 2023-12-08 DIAGNOSIS — Z8659 Personal history of other mental and behavioral disorders: Secondary | ICD-10-CM
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
## 2023-12-09 ENCOUNTER — Encounter: Payer: Self-pay | Admitting: Family Medicine

## 2023-12-09 ENCOUNTER — Ambulatory Visit: Payer: Self-pay | Admitting: Family Medicine

## 2023-12-09 VITALS — BP 92/63 | HR 93 | Temp 97.1°F | Wt 207.0 lb

## 2023-12-09 DIAGNOSIS — F331 Major depressive disorder, recurrent, moderate: Secondary | ICD-10-CM | POA: Insufficient documentation

## 2023-12-09 DIAGNOSIS — O99210 Obesity complicating pregnancy, unspecified trimester: Secondary | ICD-10-CM

## 2023-12-09 DIAGNOSIS — F411 Generalized anxiety disorder: Secondary | ICD-10-CM | POA: Insufficient documentation

## 2023-12-09 DIAGNOSIS — F32A Depression, unspecified: Secondary | ICD-10-CM

## 2023-12-09 DIAGNOSIS — Z3A32 32 weeks gestation of pregnancy: Secondary | ICD-10-CM

## 2023-12-09 DIAGNOSIS — Z3480 Encounter for supervision of other normal pregnancy, unspecified trimester: Secondary | ICD-10-CM

## 2023-12-09 DIAGNOSIS — Z8659 Personal history of other mental and behavioral disorders: Secondary | ICD-10-CM | POA: Insufficient documentation

## 2023-12-09 DIAGNOSIS — O09529 Supervision of elderly multigravida, unspecified trimester: Secondary | ICD-10-CM

## 2023-12-09 DIAGNOSIS — O99212 Obesity complicating pregnancy, second trimester: Secondary | ICD-10-CM

## 2023-12-09 DIAGNOSIS — Z3483 Encounter for supervision of other normal pregnancy, third trimester: Secondary | ICD-10-CM

## 2023-12-09 DIAGNOSIS — O09522 Supervision of elderly multigravida, second trimester: Secondary | ICD-10-CM

## 2023-12-09 NOTE — Progress Notes (Signed)
 Here for MH RV at 32 1/7. Kick counts reviewed and 3 cards given. Patient states she is very tired, and not sleeping well. Given [facility] number to call. Rosiland Cooks, RN
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
## 2023-12-22 ENCOUNTER — Ambulatory Visit: Payer: Self-pay | Admitting: Licensed Clinical Social Worker

## 2023-12-22 DIAGNOSIS — F411 Generalized anxiety disorder: Secondary | ICD-10-CM

## 2023-12-22 DIAGNOSIS — Z8659 Personal history of other mental and behavioral disorders: Secondary | ICD-10-CM

## 2023-12-22 DIAGNOSIS — F331 Major depressive disorder, recurrent, moderate: Secondary | ICD-10-CM
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
## 2023-12-23 ENCOUNTER — Ambulatory Visit: Payer: Self-pay | Admitting: Family Medicine

## 2023-12-23 VITALS — BP 103/62 | HR 88 | Temp 97.5°F | Wt 207.4 lb

## 2023-12-23 DIAGNOSIS — Z3483 Encounter for supervision of other normal pregnancy, third trimester: Secondary | ICD-10-CM

## 2023-12-23 DIAGNOSIS — Z87898 Personal history of other specified conditions: Secondary | ICD-10-CM

## 2023-12-23 DIAGNOSIS — O99213 Obesity complicating pregnancy, third trimester: Secondary | ICD-10-CM

## 2023-12-23 DIAGNOSIS — O09523 Supervision of elderly multigravida, third trimester: Secondary | ICD-10-CM

## 2023-12-23 DIAGNOSIS — O09529 Supervision of elderly multigravida, unspecified trimester: Secondary | ICD-10-CM

## 2023-12-23 DIAGNOSIS — F32A Depression, unspecified: Secondary | ICD-10-CM

## 2023-12-23 DIAGNOSIS — O99210 Obesity complicating pregnancy, unspecified trimester: Secondary | ICD-10-CM

## 2023-12-23 DIAGNOSIS — Z3480 Encounter for supervision of other normal pregnancy, unspecified trimester: Secondary | ICD-10-CM

## 2023-12-23 DIAGNOSIS — F419 Anxiety disorder, unspecified: Secondary | ICD-10-CM

## 2023-12-23 DIAGNOSIS — Z3A34 34 weeks gestation of pregnancy: Secondary | ICD-10-CM
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
## 2023-12-23 NOTE — Patient Instructions (Signed)
 Pregnancy Continue taking your prenatal vitamin daily and aspirin  81 mg daily.  Please schedule your next prenatal visit for about 2 weeks from now.  Please visit WIC (women's, infants, and children program) to see about their breast feeding peer support program. You can start this program to get tips and tricks for successful breast feeding of your baby.   Prenatal Classes If delivering at [facility] with [facility] or [facility]: Go to [email or web address]   If delivering at [facility]: Go to [email or web address] and search for: "Pregnancy and Parenting Classes" "Prepared Childbirth Classes"  Resource regarding exposures that could affect pregnancy: Mother To Eileen Dudley is a Dentist with lots of information on the effects of many medications and exposures on your pregnancy. It is free to use, including their web site, phone line, text service, app, or email and live chat.  [email or web address]/ Email or live chat: MotherToBaby.org Phone: [phone] Text: [phone] App: search "LactRx"   Maternal Mental Health If you start to develop the below symptoms of depression, please reach out to us  for an appointment. There is also a Biomedical scientist Health Hotline at [phone]). This hotline has trained counselors, doulas, and midwifes to real-time support, information, and resources.  Feeling sad or hopeless most of the time Lack of interest in things you used to enjoy Less interest in caring for yourself (dressing, fixing hair) Trouble concentrating Trouble coping with daily tasks Constant worry about your baby Sleeping or eating too much or too little Feeling very anxious or nervous Unexplained irritability or anger Unwanted or scary thoughts Feeling that you are not a good mother Thoughts of hurting yourself or your baby  If you feel you are experiencing a  mental health crisis, please reach out to the National Suicide Prevention Hotline at 1-800-273-TALK 8576599896).    Therapy You can use the following website to start looking for a therapist. Remember, finding a therapist you click with it a lot like speed dating - you have to sort through a bunch of lemons before finding what you like!  [name], LCSW Licensed Clinical Social Worker at [facility] Department  Phone: [phone] [address], [place] [address]  www.[email or web address] Access to care line (24/7) [phone] Treatment information  Crisis assistance  Mobile crisis team connection Mental health needs  Substance use disorder  Intellectual and developmental disabilities  [facility] Grief Counsel[facility] For children and teens who have experienced the death of a loved one Phone: [phone] [address], [place] [address] [email or web address]  [email or web address].[email or web address]/us  Search for [place], [place] (or city of your choice).  On the next page, you will see filter options at the top.  You may filter therapists by insurance they take, issues you would like to work on, or therapy types.   [email or web address]

## 2023-12-23 NOTE — Assessment & Plan Note (Signed)
 Doing well. BP normal at 103/62. TWG 7 lb 6.4 oz (3.357 kg). Taking prenatal vitamin daily. Next prenatal appointment in 2 weeks.   Discussed or disclosed in AVS: - Breast feeding support through [facility] breast feeding peer counselor program - Mood resources, including 988, Maternal Mental Health Line, and counselor [name], LCSW, here at [facility] - Prenatal classes - Pain control during delivery: epidural  - Support person during delivery: Husband  - Anticipated contraception: vasectomy  - Pre-eclampsia warning signs: Signs and symptoms of preeclampsia were verbally reviewed with warning signs, when and how to call. A written handout with tips on how to take your blood pressure, as well as warning signs was provided to the patient.

## 2023-12-23 NOTE — Progress Notes (Addendum)
 2 week [facility] RV appt scheduled and reminder card given. Per client, has not scheduled [facility] appt as waiting to see if approved for Medicaid. Aware of MFM ([place]) 01/26/24 appts. Ariel Begun, RN

## 2023-12-23 NOTE — Assessment & Plan Note (Signed)
 Chronic, in remission. Reports she no longer has cravings and finds it much easier than before to resist ETOH.

## 2023-12-23 NOTE — Assessment & Plan Note (Signed)
 Taking asa 81 mg some days, but often forgets. BP normal today. No pre-E warning signs.

## 2023-12-23 NOTE — Assessment & Plan Note (Signed)
 Ms. Gomm reports greatly improved mood with Lexapro  20 mg daily and counseling here with LCSW A. Galvin Jules. At this time, holding off on referral to [facility] perinatal mood disorders clinic as she is awaiting word if she qualifies for full Medicaid. Happy with her improvement thus far. No changes in plan needed today.

## 2023-12-23 NOTE — Assessment & Plan Note (Signed)
 Chronic, in remission. She reports this was ~ 12 years ago and does not feel like she struggles with cravings anymore. Doing quite well despite the stressors of pregnancy.

## 2023-12-23 NOTE — Assessment & Plan Note (Signed)
 Next MFM appointment 06/09.

## 2023-12-25 NOTE — Addendum Note (Signed)
 Addended by: Crystale Giannattasio on: 12/25/2023 12:26 PM   Modules accepted: Orders

## 2023-12-29 ENCOUNTER — Other Ambulatory Visit: Payer: Self-pay

## 2023-12-29 ENCOUNTER — Ambulatory Visit (HOSPITAL_BASED_OUTPATIENT_CLINIC_OR_DEPARTMENT_OTHER): Payer: Self-pay

## 2023-12-29 ENCOUNTER — Ambulatory Visit: Payer: Self-pay | Attending: Maternal & Fetal Medicine | Admitting: Maternal & Fetal Medicine

## 2023-12-29 VITALS — BP 108/77 | HR 116

## 2023-12-29 DIAGNOSIS — O99213 Obesity complicating pregnancy, third trimester: Secondary | ICD-10-CM

## 2023-12-29 DIAGNOSIS — Z362 Encounter for other antenatal screening follow-up: Secondary | ICD-10-CM | POA: Insufficient documentation

## 2023-12-29 DIAGNOSIS — E669 Obesity, unspecified: Secondary | ICD-10-CM

## 2023-12-29 DIAGNOSIS — Z3A35 35 weeks gestation of pregnancy: Secondary | ICD-10-CM

## 2023-12-29 DIAGNOSIS — H20829 Vogt-Koyanagi syndrome, unspecified eye: Secondary | ICD-10-CM | POA: Insufficient documentation

## 2023-12-29 DIAGNOSIS — O0933 Supervision of pregnancy with insufficient antenatal care, third trimester: Secondary | ICD-10-CM

## 2023-12-29 DIAGNOSIS — O2693 Pregnancy related conditions, unspecified, third trimester: Secondary | ICD-10-CM | POA: Diagnosis not present

## 2023-12-29 DIAGNOSIS — O09529 Supervision of elderly multigravida, unspecified trimester: Secondary | ICD-10-CM

## 2023-12-29 DIAGNOSIS — O09523 Supervision of elderly multigravida, third trimester: Secondary | ICD-10-CM

## 2023-12-29 DIAGNOSIS — O99210 Obesity complicating pregnancy, unspecified trimester: Secondary | ICD-10-CM

## 2023-12-29 DIAGNOSIS — O0930 Supervision of pregnancy with insufficient antenatal care, unspecified trimester: Secondary | ICD-10-CM

## 2023-12-29 DIAGNOSIS — Z3480 Encounter for supervision of other normal pregnancy, unspecified trimester: Secondary | ICD-10-CM

## 2023-12-29 NOTE — Progress Notes (Signed)
 After review, MFM consult with provider is not indicated for today  Kristi Petties, MD 12/29/2023 2:39 PM  [facility]

## 2023-12-30 ENCOUNTER — Ambulatory Visit: Payer: Self-pay | Admitting: Licensed Clinical Social Worker

## 2023-12-30 DIAGNOSIS — F411 Generalized anxiety disorder: Secondary | ICD-10-CM

## 2023-12-30 DIAGNOSIS — F331 Major depressive disorder, recurrent, moderate: Secondary | ICD-10-CM

## 2023-12-30 NOTE — Progress Notes (Signed)
 Counselor/Therapist Progress Note  Patient ID: Eileen Dudley, MRN: [id number],    Date: 12/30/2023  Time Spent: 57 total minutes. I spent 52 minutes on real-time audio and video with the patient on the date of service. I spent an additional 5  minutes on pre- and post-visit activities on the date of service including collateral, chart review, team discussion, and documentation.   Treatment Type: Psychotherapy  Reported Symptoms: Continued anxiety, anxious thoughts, sleep disturbance; mood improvement  Mental Status Exam:  Appearance:   Casual     Behavior:  Appropriate, Sharing, and Motivated  Motor:  Normal  Speech/Language:   Clear and Coherent and Normal Rate  Affect:  Appropriate, Congruent, and Full Range  Mood:  anxious  Thought process:  tangential and within normal range   Thought content:    WNL  Sensory/Perceptual disturbances:    WNL  Orientation:  oriented to person, place, time/date, situation, and day of week  Attention:  Good  Concentration:  Good  Memory:  WNL  Fund of knowledge:   Good  Insight:    Fair  Judgment:   Good  Impulse Control:  Good   Risk Assessment: Danger to Self:  No Self-injurious Behavior: No Danger to Others: No Duty to Warn:no Physical Aggression / Violence:No  Access to Firearms a concern: No  Gang Involvement:No   Subjective: Patient shares that she has been doing pretty well, better, since moving into her new home, she is enjoying having more space for everyone. She continues to describe anxiety, anxious thoughts and worrying about many things. Patient reports that she used the mindfulness breathing exercise that was practiced during previous session and reports it helped. Patient was engaged and cooperative throughout the session using time effectively to discuss thoughts and feelings. Patient presents anxious and needs redirected to stay focused. Patient voices continued motivation for treatment and voices agreement with  treatment plan.   Interventions: Cognitive Behavioral Therapy, Mindfulness Meditation, and Client Centered  LCSW established psychological safety. LCSW met with patient to identify needs related to stressors and functioning, and assess and monitor for signs and symptoms of depressed mood and anxiety, and assess safety. Checked in with patient regarding how they have been doing since the last follow-up session. Reviewed previous session regarding mindfulness and somatic practices and treatment plan. LCSW provided supportive space for patient to process thoughts and feelings related to multiple stressors leading to increased anxiety and anxious thoughts. LCSW facilitated present-moment awareness by inviting patient to notice the felt experience of anxiety in the chest with non-judgmental observation. Patient was guided to observe bodily sensations with curiosity and allow the emotional experience without avoidance or suppression.  Diagnosis:   ICD-10-CM   1. Major depressive disorder, recurrent episode, moderate (HCC)  F33.1     2. Generalized anxiety disorder  F41.1      Plan: Patient's goal of treatment is to learn more about regulating her emotions. She wants to work on getting over stuff that her partner put her through that has lead to trust issues. She wants to work on ruminating thoughts, being more confident, and shares she wants someone to talk to, because she doesn't really have anyone.    Long-Term Goal Over the next year, reduce anxiety symptoms and increase the patient's ability to tolerate uncertainty, regulate worry, and engage in valued actions despite fear, as evidenced by patient self-report and/or GAD-7 scores. Goals and Objectives 1. Goal: Increase awareness and regulation of anxious thoughts Patient will identify and track at  least 3 recurring anxiety-provoking thoughts or themes per week. Patient will reframe or defuse 2+ anxious thoughts per week using thought records or  defusion strategies. Patient will identify 2-3 anxiety-maintaining beliefs (e.g., "If I let my guard down, something bad will happen") and explore alternatives.   2. Goal: Decrease avoidance and increase engagement in valued activities Patient will identify 3 avoided situations related to anxiety and develop a graded exposure hierarchy. Patient will face at least one avoided situation per week, increasing in difficulty over time. Patient will set and act on at least one value-based goal per week, even in the presence of anxiety.   3. Goal: Improve emotional and physiological self-regulation Patient will identify personal anxiety cues and early warning signs across contexts (tracked weekly). Patient will use at least one regulation strategy (e.g., breathwork, grounding) daily for 4 weeks. Patient will reduce panic or high-anxiety episodes (e.g., from 5/week to 2/week within 8 weeks).   4. Goal: Build tolerance for uncertainty and reduce preemptive problem-solving Patient will identify 2-3 triggers for intolerance of uncertainty (IU) and associated behaviors. Patient will practice "willingness" skills weekly (e.g., accepting discomfort, delaying worry). Patient will reduce time spent on worry loops or reassurance-seeking by 50% within 8 weeks (self-monitored).   5. Goal: Enhance present-moment focus and reduce cognitive over-engagement Patient will engage in daily mindfulness practice (5-10 min/day) using breath, sound, or movement-based anchors. Patient will learn and apply at least 3 "anchor-to-present" techniques when overwhelmed by worry.   Interventions Cognitive Behavioral Therapy (CBT) Use worry logs and cognitive restructuring tools to challenge overestimations of danger. Conduct behavioral experiments to test feared predictions and encourage disconfirmation. Create graded exposure hierarchy and track approach behaviors. Challenge control-based beliefs related to intolerance of  uncertainty. Practice attention refocusing techniques when worry is intrusive. Acceptance and Commitment Therapy (ACT) Teach defusion skills (e.g., "Thank you, mind," "I'm noticing the thought that."). Clarify values and connect goals to values-based living. Support willingness to act with anxiety present. Use observer-self metaphors and "You Are Here" exercise to ground in the present. Practice acceptance of uncertainty and discomfort. Dialectical Behavior Therapy (DBT) Practice Wise Mind to integrate emotion and reason during anxious thinking. Use Opposite Action to encourage engagement in avoided tasks. Apply TIPP skills for acute anxiety regulation. Use mindfulness to sit with uncertainty and delay impulsive problem-solving. Mindfulness-Based Interventions Practice daily mindful breathing, sound, or movement anchoring (5-10 min/day). Introduce "5-4-3-2-1," breath tracking, and mindful walking. Use labeling and awareness techniques ("This is a worry thought"). Integrate mindfulness with somatic grounding strategies. Somatic & Nervous System Regulation Use Polyvagal-informed techniques (e.g., paced breathing, cold water splash, "heart hug"). Practice body scan, orienting, grounding, and interoception to regulate arousal. Encourage grounding with somatic cues (e.g., feet on the floor, belly breathing). Psychoeducation Educate about the nature of worry, uncertainty, and the illusion of control. Normalize anxiety responses and the paradox of reassurance-seeking.   Long-Term Goal: Reduce depressive symptoms and improve overall functioning and quality of life, as measured by depression screening tools and/or patient self-report.   Goals and Objectives 1. Goal: Improve mood and increase daily functioning Patient will engage in at least one pleasurable or meaningful activity per day, 5 days/week for 4 weeks.   2. Goal: Develop greater emotional regulation and distress tolerance Patient  will demonstrate use of at least two DBT distress tolerance skills during high emotional distress (measured via session self-report/journal). Patient will identify and label 4 or more emotions accurately in-the-moment (measured via emotion log/self-report).   3. Goal: Increase psychological  flexibility and reduce cognitive fusion Patient will identify 3 recurring self-critical thoughts and practice defusion techniques with each (tracked weekly). Patient will describe values in at least 3 life domains (e.g., relationships, career, health) and commit to value-based actions.   4. Goal: Improve nervous system regulation and somatic awareness Patient will practice at least one regulation skill (e.g., vagal breathing, somatic tracking) daily for 4 weeks.   5. Goal: Strengthen mindful awareness and self-compassion Patient will complete at least 4 guided meditations per week (tracked via journal or self-report). Patient will practice self-compassion statements at least once per day.   6. Goal: Strengthen cognitive flexibility and reduce unhelpful thoughts Patient will identify and record at least 3 recurring cognitive distortions (e.g., catastrophizing, all-or-nothing thinking) weekly. Patient will reframe at least 2 automatic negative thoughts per week using thought records or similar tools. Patient will identify 2-3 negative core beliefs contributing to depressive symptoms and generate alternative balanced beliefs.   Interventions Cognitive-Behavioral Therapy (CBT) Identify and challenge automatic negative thoughts using Thought Records. Create an activity schedule for behavioral activation. Reframe cognitive distortions using thought-challenging and Socratic questioning. Identify and explore negative core beliefs contributing to depression. Acceptance and Commitment Therapy (ACT) Teach defusion techniques (e.g., "hand in face," "I'm having the thought that."). Use values clarification tools  (e.g., values card sort, life domains mapping). Establish committed action goals aligned with personal values. Introduce ACT-based mindfulness practices (e.g., "Leaves on a stream"). Dialectical Behavior Therapy (DBT) Teach and practice distress tolerance skills (TIPP, Self-Soothing). Use emotion regulation skills like "Check the Facts" and "Opposite Action." Practice "Wise Mind" and DBT journaling to integrate logic and emotion. Mindfulness-Based Interventions Introduce daily mindful breathing or body scan (10-15 minutes/day). Use meditations like "Thoughts Are Not Facts" and "Leaves on a Stream." Incorporate mindful observation of thoughts and guided journaling. Encourage non-judgmental awareness of present-moment experience. Somatic & Polyvagal-Informed Practices Map autonomic states using the Polyvagal Ladder framework. Practice interoception, pendulation, and somatic tracking. Use co-regulation practices (e.g., hand on heart, grounding touch). Incorporate breathwork (e.g., paced breathing, humming). Behavioral Activation (BA) Monitor and schedule pleasurable/mastery-based activities. Use graded task assignments to counteract avoidance and withdrawal. Track mood changes in response to increased activity. Self-Compassion Practices Use Kristin Neff's guided self-compassion exercises in session. Develop and repeat personalized self-compassion statements. Engage in reflective journaling on inner critic vs. compassionate voice.  Future Appointments  Date Time Provider Department Center  01/06/2024 10:50 AM [facility] PROVIDER [facility] None  01/12/2024 11:00 AM [name], LCSW [facility] None  01/26/2024 10:45 AM [facility] CON[facility]ULT [facility] None  01/26/2024 11:00 AM [facility]    [name], LCSW

## 2024-01-06 ENCOUNTER — Ambulatory Visit: Payer: Self-pay

## 2024-01-12 ENCOUNTER — Ambulatory Visit: Payer: Self-pay | Admitting: Licensed Clinical Social Worker

## 2024-01-12 DIAGNOSIS — F331 Major depressive disorder, recurrent, moderate: Secondary | ICD-10-CM

## 2024-01-12 DIAGNOSIS — F411 Generalized anxiety disorder: Secondary | ICD-10-CM

## 2024-01-12 NOTE — Progress Notes (Signed)
 Counselor/Therapist Progress Note  Patient ID: Eileen Dudley, MRN: [id number],    Date: 01/12/2024  Time Spent: 54 total minutes. I spent 49 minutes on real-time audio and video with the patient on the date of service. I spent an additional 5  minutes on pre- and post-visit activities on the date of service including collateral, chart review, team discussion, and documentation.     Treatment Type: Psychotherapy  Reported Symptoms: Anxiety, anxious thoughts, sleep improvement; stable mood   Mental Status Exam:  Appearance:   Casual and Neat     Behavior:  Appropriate, Sharing, and Motivated  Motor:  Normal  Speech/Language:   Clear and Coherent and Normal Rate  Affect:  Appropriate, Congruent, and Full Range  Mood:  normal  Thought process:  normal  Thought content:    WNL  Sensory/Perceptual disturbances:    WNL  Orientation:  oriented to person, place, time/date, situation, and day of week  Attention:  Fair  Concentration:  Fair  Memory:  WNL  Fund of knowledge:   Good  Insight:    Fair  Judgment:   Good  Impulse Control:  Good   Risk Assessment: Danger to Self:  No Self-injurious Behavior: No Danger to Others: No Duty to Warn:no Physical Aggression / Violence:No  Access to Firearms a concern: No  Gang Involvement:No   Subjective: Patient reports that her mood has been stable; continues anxiety symptoms. She reports fears and anxiety about the birthing process and the pain. She shares she has been working on being more aware and in the moment.   Patient was receptive to feedback and intervention from LCSW and actively and effectively participated throughout the session. Patient is likely to benefit from future treatment because they remain motivated to decrease anxiety symptoms and reports benefit of regular sessions in addressing these symptoms.    Interventions: Cognitive Behavioral Therapy, Mindfulness Meditation, and client centered  LCSW established  psychological safety. Met with patient to assess current needs related to stressors and daily functioning. Monitored for symptoms of anxiety and depression and assessed for safety. Conducted a follow-up check-in on the patient's well-being since the last session. LCSW reviewed mindfulness practices to support emotion regulation and explored the patient's fears surrounding childbirth and the upcoming delivery. Psychoeducation was provided on cognitive fusion and defusion to help the patient identify and distance from unhelpful thoughts. Additionally, LCSW introduced techniques for regulating the nervous system, including applying ice across the forehead, engaging in slow, paced breathing, and counting to 120, all aimed at activating the parasympathetic nervous system and reducing physiological arousal.  Diagnosis:   ICD-10-CM   1. Major depressive disorder, recurrent episode, moderate (HCC)  F33.1     2. Generalized anxiety disorder  F41.1       Plan: Plan: Patient's goal of treatment is to learn more about regulating her emotions. She wants to work on getting over stuff that her partner put her through that has lead to trust issues. She wants to work on ruminating thoughts, being more confident, and shares she wants someone to talk to, because she doesn't really have anyone.    Long-Term Goal Over the next year, reduce anxiety symptoms and increase the patient's ability to tolerate uncertainty, regulate worry, and engage in valued actions despite fear, as evidenced by patient self-report and/or GAD-7 scores. Goals and Objectives 1. Goal: Increase awareness and regulation of anxious thoughts Patient will identify and track at least 3 recurring anxiety-provoking thoughts or themes per week. Patient will reframe or  defuse 2+ anxious thoughts per week using thought records or defusion strategies. Patient will identify 2-3 anxiety-maintaining beliefs (e.g., "If I let my guard down, something bad will  happen") and explore alternatives.   2. Goal: Decrease avoidance and increase engagement in valued activities Patient will identify 3 avoided situations related to anxiety and develop a graded exposure hierarchy. Patient will face at least one avoided situation per week, increasing in difficulty over time. Patient will set and act on at least one value-based goal per week, even in the presence of anxiety.   3. Goal: Improve emotional and physiological self-regulation Patient will identify personal anxiety cues and early warning signs across contexts (tracked weekly). Patient will use at least one regulation strategy (e.g., breathwork, grounding) daily for 4 weeks. Patient will reduce panic or high-anxiety episodes (e.g., from 5/week to 2/week within 8 weeks).   4. Goal: Build tolerance for uncertainty and reduce preemptive problem-solving Patient will identify 2-3 triggers for intolerance of uncertainty (IU) and associated behaviors. Patient will practice "willingness" skills weekly (e.g., accepting discomfort, delaying worry). Patient will reduce time spent on worry loops or reassurance-seeking by 50% within 8 weeks (self-monitored).   5. Goal: Enhance present-moment focus and reduce cognitive over-engagement Patient will engage in daily mindfulness practice (5-10 min/day) using breath, sound, or movement-based anchors. Patient will learn and apply at least 3 "anchor-to-present" techniques when overwhelmed by worry.   Interventions Cognitive Behavioral Therapy (CBT) Use worry logs and cognitive restructuring tools to challenge overestimations of danger. Conduct behavioral experiments to test feared predictions and encourage disconfirmation. Create graded exposure hierarchy and track approach behaviors. Challenge control-based beliefs related to intolerance of uncertainty. Practice attention refocusing techniques when worry is intrusive. Acceptance and Commitment Therapy (ACT) Teach  defusion skills (e.g., "Thank you, mind," "I'm noticing the thought that."). Clarify values and connect goals to values-based living. Support willingness to act with anxiety present. Use observer-self metaphors and "You Are Here" exercise to ground in the present. Practice acceptance of uncertainty and discomfort. Dialectical Behavior Therapy (DBT) Practice Wise Mind to integrate emotion and reason during anxious thinking. Use Opposite Action to encourage engagement in avoided tasks. Apply TIPP skills for acute anxiety regulation. Use mindfulness to sit with uncertainty and delay impulsive problem-solving. Mindfulness-Based Interventions Practice daily mindful breathing, sound, or movement anchoring (5-10 min/day). Introduce "5-4-3-2-1," breath tracking, and mindful walking. Use labeling and awareness techniques ("This is a worry thought"). Integrate mindfulness with somatic grounding strategies. Somatic & Nervous System Regulation Use Polyvagal-informed techniques (e.g., paced breathing, cold water splash, "heart hug"). Practice body scan, orienting, grounding, and interoception to regulate arousal. Encourage grounding with somatic cues (e.g., feet on the floor, belly breathing). Psychoeducation Educate about the nature of worry, uncertainty, and the illusion of control. Normalize anxiety responses and the paradox of reassurance-seeking.   Long-Term Goal: Reduce depressive symptoms and improve overall functioning and quality of life, as measured by depression screening tools and/or patient self-report.   Goals and Objectives 1. Goal: Improve mood and increase daily functioning Patient will engage in at least one pleasurable or meaningful activity per day, 5 days/week for 4 weeks.   2. Goal: Develop greater emotional regulation and distress tolerance Patient will demonstrate use of at least two DBT distress tolerance skills during high emotional distress (measured via session  self-report/journal). Patient will identify and label 4 or more emotions accurately in-the-moment (measured via emotion log/self-report).   3. Goal: Increase psychological flexibility and reduce cognitive fusion Patient will identify 3 recurring self-critical thoughts and  practice defusion techniques with each (tracked weekly). Patient will describe values in at least 3 life domains (e.g., relationships, career, health) and commit to value-based actions.   4. Goal: Improve nervous system regulation and somatic awareness Patient will practice at least one regulation skill (e.g., vagal breathing, somatic tracking) daily for 4 weeks.   5. Goal: Strengthen mindful awareness and self-compassion Patient will complete at least 4 guided meditations per week (tracked via journal or self-report). Patient will practice self-compassion statements at least once per day.   6. Goal: Strengthen cognitive flexibility and reduce unhelpful thoughts Patient will identify and record at least 3 recurring cognitive distortions (e.g., catastrophizing, all-or-nothing thinking) weekly. Patient will reframe at least 2 automatic negative thoughts per week using thought records or similar tools. Patient will identify 2-3 negative core beliefs contributing to depressive symptoms and generate alternative balanced beliefs.   Interventions Cognitive-Behavioral Therapy (CBT) Identify and challenge automatic negative thoughts using Thought Records. Create an activity schedule for behavioral activation. Reframe cognitive distortions using thought-challenging and Socratic questioning. Identify and explore negative core beliefs contributing to depression. Acceptance and Commitment Therapy (ACT) Teach defusion techniques (e.g., "hand in face," "I'm having the thought that."). Use values clarification tools (e.g., values card sort, life domains mapping). Establish committed action goals aligned with personal values. Introduce  ACT-based mindfulness practices (e.g., "Leaves on a stream"). Dialectical Behavior Therapy (DBT) Teach and practice distress tolerance skills (TIPP, Self-Soothing). Use emotion regulation skills like "Check the Facts" and "Opposite Action." Practice "Wise Mind" and DBT journaling to integrate logic and emotion. Mindfulness-Based Interventions Introduce daily mindful breathing or body scan (10-15 minutes/day). Use meditations like "Thoughts Are Not Facts" and "Leaves on a Stream." Incorporate mindful observation of thoughts and guided journaling. Encourage non-judgmental awareness of present-moment experience. Somatic & Polyvagal-Informed Practices Map autonomic states using the Polyvagal Ladder framework. Practice interoception, pendulation, and somatic tracking. Use co-regulation practices (e.g., hand on heart, grounding touch). Incorporate breathwork (e.g., paced breathing, humming). Behavioral Activation (BA) Monitor and schedule pleasurable/mastery-based activities. Use graded task assignments to counteract avoidance and withdrawal. Track mood changes in response to increased activity. Self-Compassion Practices Use Kristin Neff's guided self-compassion exercises in session. Develop and repeat personalized self-compassion statements. Engage in reflective journaling on inner critic vs. compassionate voice.  Future Appointments  Date Time Provider Department Center  01/14/2024  4:00 PM [facility] PROVIDER [facility] None  01/20/2024 10:00 AM Ellender Palma, LCSW [facility] None  01/26/2024 10:45 AM [facility] None  01/26/2024 11:00 AM [facility]    Palma Ellender, LCSW

## 2024-01-14 ENCOUNTER — Ambulatory Visit: Payer: Self-pay | Admitting: Nurse Practitioner

## 2024-01-14 VITALS — BP 127/84 | HR 91 | Temp 97.5°F | Wt 214.2 lb

## 2024-01-14 DIAGNOSIS — O99210 Obesity complicating pregnancy, unspecified trimester: Secondary | ICD-10-CM

## 2024-01-14 DIAGNOSIS — O99213 Obesity complicating pregnancy, third trimester: Secondary | ICD-10-CM

## 2024-01-14 DIAGNOSIS — O09523 Supervision of elderly multigravida, third trimester: Secondary | ICD-10-CM

## 2024-01-14 DIAGNOSIS — Z3480 Encounter for supervision of other normal pregnancy, unspecified trimester: Secondary | ICD-10-CM

## 2024-01-14 DIAGNOSIS — Z3483 Encounter for supervision of other normal pregnancy, third trimester: Secondary | ICD-10-CM

## 2024-01-14 DIAGNOSIS — Z3A37 37 weeks gestation of pregnancy: Secondary | ICD-10-CM

## 2024-01-14 DIAGNOSIS — O09529 Supervision of elderly multigravida, unspecified trimester: Secondary | ICD-10-CM

## 2024-01-14 NOTE — Progress Notes (Signed)
 Here for MH RV at 37 2/7. 36 week labs today and packet reviewed and given. Patient is self-collecting labs today. Needs repeat BP. Patient is unsure why she has another U/S scheduled. Hulan Parish, RN

## 2024-01-15 ENCOUNTER — Encounter: Payer: Self-pay | Admitting: Nurse Practitioner
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
## 2024-01-16 NOTE — Progress Notes (Signed)
 Added PHQ9  values.Hulan Parish, RN

## 2024-01-17 LAB — CHLAMYDIA/GC NAA, CONFIRMATION
Chlamydia trachomatis, NAA: NEGATIVE
Neisseria gonorrhoeae, NAA: NEGATIVE

## 2024-01-17 LAB — CULTURE, BETA STREP (GROUP B ONLY): Strep Gp B Culture: POSITIVE — AB

## 2024-01-20 ENCOUNTER — Ambulatory Visit: Payer: MEDICAID | Admitting: Licensed Clinical Social Worker

## 2024-01-20 DIAGNOSIS — F331 Major depressive disorder, recurrent, moderate: Secondary | ICD-10-CM

## 2024-01-20 DIAGNOSIS — F411 Generalized anxiety disorder: Secondary | ICD-10-CM
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
## 2024-01-21 ENCOUNTER — Ambulatory Visit: Payer: MEDICAID | Admitting: Nurse Practitioner

## 2024-01-21 ENCOUNTER — Telehealth: Payer: Self-pay | Admitting: Obstetrics and Gynecology

## 2024-01-21 VITALS — BP 116/75 | HR 89 | Temp 97.5°F | Wt 218.4 lb

## 2024-01-21 DIAGNOSIS — O09529 Supervision of elderly multigravida, unspecified trimester: Secondary | ICD-10-CM

## 2024-01-21 DIAGNOSIS — O09523 Supervision of elderly multigravida, third trimester: Secondary | ICD-10-CM

## 2024-01-21 DIAGNOSIS — Z3480 Encounter for supervision of other normal pregnancy, unspecified trimester: Secondary | ICD-10-CM

## 2024-01-21 DIAGNOSIS — Z3A38 38 weeks gestation of pregnancy: Secondary | ICD-10-CM

## 2024-01-21 DIAGNOSIS — B951 Streptococcus, group B, as the cause of diseases classified elsewhere: Secondary | ICD-10-CM

## 2024-01-21 DIAGNOSIS — O99213 Obesity complicating pregnancy, third trimester: Secondary | ICD-10-CM

## 2024-01-21 DIAGNOSIS — O99210 Obesity complicating pregnancy, unspecified trimester: Secondary | ICD-10-CM

## 2024-01-21 DIAGNOSIS — Z3483 Encounter for supervision of other normal pregnancy, third trimester: Secondary | ICD-10-CM

## 2024-01-21 HISTORY — DX: Streptococcus, group b, as the cause of diseases classified elsewhere: B95.1

## 2024-01-21 NOTE — Telephone Encounter (Signed)
 Pt wishes to cancel her MFM visit along with ultrasound scheduled for 7/7 at [facility].    Barnie PHEBE Dixons, MS, CGC

## 2024-01-21 NOTE — Progress Notes (Signed)
 Per EPIC note by Beuford Dixons, client cancelled 01/26/24 MFM appt for MD consult and US . 1 week MHC RV appt scheduled and reminder card given. Burnadette Lowers, RN
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
## 2024-01-22 ENCOUNTER — Ambulatory Visit: Payer: Self-pay | Admitting: Family Medicine

## 2024-01-22 NOTE — Progress Notes (Signed)
 GBS (+). G/C screening negative. Chart updated. To be discussed at pnv.   Dorothyann Helling, MD 01/22/24  2:49 PM

## 2024-01-26 ENCOUNTER — Ambulatory Visit: Payer: Self-pay

## 2024-01-26 ENCOUNTER — Encounter: Payer: Self-pay | Admitting: Nurse Practitioner

## 2024-01-26 DIAGNOSIS — Z3480 Encounter for supervision of other normal pregnancy, unspecified trimester: Secondary | ICD-10-CM

## 2024-01-26 DIAGNOSIS — O09529 Supervision of elderly multigravida, unspecified trimester: Secondary | ICD-10-CM

## 2024-01-26 DIAGNOSIS — O0930 Supervision of pregnancy with insufficient antenatal care, unspecified trimester: Secondary | ICD-10-CM

## 2024-01-26 DIAGNOSIS — O99210 Obesity complicating pregnancy, unspecified trimester: Secondary | ICD-10-CM

## 2024-01-27 ENCOUNTER — Ambulatory Visit: Payer: MEDICAID | Admitting: Licensed Clinical Social Worker

## 2024-01-28 ENCOUNTER — Ambulatory Visit: Payer: MEDICAID | Admitting: Physician Assistant

## 2024-01-28 VITALS — BP 101/56 | HR 106 | Temp 97.7°F | Wt 214.8 lb

## 2024-01-28 DIAGNOSIS — O99210 Obesity complicating pregnancy, unspecified trimester: Secondary | ICD-10-CM

## 2024-01-28 DIAGNOSIS — O99213 Obesity complicating pregnancy, third trimester: Secondary | ICD-10-CM

## 2024-01-28 DIAGNOSIS — F331 Major depressive disorder, recurrent, moderate: Secondary | ICD-10-CM

## 2024-01-28 DIAGNOSIS — Z3A39 39 weeks gestation of pregnancy: Secondary | ICD-10-CM

## 2024-01-28 DIAGNOSIS — Z3483 Encounter for supervision of other normal pregnancy, third trimester: Secondary | ICD-10-CM

## 2024-01-28 DIAGNOSIS — Z3480 Encounter for supervision of other normal pregnancy, unspecified trimester: Secondary | ICD-10-CM

## 2024-01-28 LAB — WET PREP FOR TRICH, YEAST, CLUE
Clue Cell Exam: NEGATIVE
Trichomonas Exam: NEGATIVE
Yeast Exam: NEGATIVE

## 2024-01-28 NOTE — Progress Notes (Signed)
 1 week MHC RV appt scheduled and reminder card given. Burnadette Lowers, RN Wet prep reviewed by A. Streilein PA-C (negative results). Burnadette Lowers, RN
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
## 2024-01-29 ENCOUNTER — Ambulatory Visit: Payer: Self-pay

## 2024-02-01 ENCOUNTER — Inpatient Hospital Stay: Payer: MEDICAID | Admitting: General Practice

## 2024-02-01 ENCOUNTER — Encounter: Payer: Self-pay | Admitting: Obstetrics and Gynecology

## 2024-02-01 ENCOUNTER — Inpatient Hospital Stay
Admission: EM | Admit: 2024-02-01 | Discharge: 2024-02-02 | DRG: 807 | Disposition: A | Discharge status: Home / Self Care | Payer: MEDICAID | Attending: Obstetrics | Admitting: Obstetrics

## 2024-02-01 ENCOUNTER — Other Ambulatory Visit: Payer: Self-pay

## 2024-02-01 DIAGNOSIS — O0930 Supervision of pregnancy with insufficient antenatal care, unspecified trimester: Secondary | ICD-10-CM

## 2024-02-01 DIAGNOSIS — O09529 Supervision of elderly multigravida, unspecified trimester: Secondary | ICD-10-CM

## 2024-02-01 DIAGNOSIS — Z7982 Long term (current) use of aspirin: Secondary | ICD-10-CM | POA: Diagnosis not present

## 2024-02-01 DIAGNOSIS — Z3A39 39 weeks gestation of pregnancy: Secondary | ICD-10-CM | POA: Diagnosis not present

## 2024-02-01 DIAGNOSIS — Z79899 Other long term (current) drug therapy: Secondary | ICD-10-CM | POA: Diagnosis not present

## 2024-02-01 DIAGNOSIS — Z3483 Encounter for supervision of other normal pregnancy, third trimester: Secondary | ICD-10-CM | POA: Diagnosis not present

## 2024-02-01 DIAGNOSIS — O99824 Streptococcus B carrier state complicating childbirth: Secondary | ICD-10-CM | POA: Diagnosis present

## 2024-02-01 DIAGNOSIS — O26893 Other specified pregnancy related conditions, third trimester: Secondary | ICD-10-CM | POA: Diagnosis present

## 2024-02-01 DIAGNOSIS — Z87891 Personal history of nicotine dependence: Secondary | ICD-10-CM | POA: Diagnosis not present

## 2024-02-01 DIAGNOSIS — F32A Depression, unspecified: Secondary | ICD-10-CM | POA: Diagnosis present

## 2024-02-01 DIAGNOSIS — O99210 Obesity complicating pregnancy, unspecified trimester: Secondary | ICD-10-CM | POA: Diagnosis present

## 2024-02-01 DIAGNOSIS — F419 Anxiety disorder, unspecified: Secondary | ICD-10-CM | POA: Diagnosis present

## 2024-02-01 DIAGNOSIS — Z3493 Encounter for supervision of normal pregnancy, unspecified, third trimester: Secondary | ICD-10-CM

## 2024-02-01 DIAGNOSIS — B951 Streptococcus, group B, as the cause of diseases classified elsewhere: Secondary | ICD-10-CM | POA: Diagnosis present

## 2024-02-01 DIAGNOSIS — Z3480 Encounter for supervision of other normal pregnancy, unspecified trimester: Principal | ICD-10-CM

## 2024-02-01 LAB — COMPREHENSIVE METABOLIC PANEL WITH GFR
ALT: 12 U/L (ref 0–44)
AST: 26 U/L (ref 15–41)
Albumin: 2.7 g/dL — ABNORMAL LOW (ref 3.5–5.0)
Alkaline Phosphatase: 198 U/L — ABNORMAL HIGH (ref 38–126)
Anion gap: 8 (ref 5–15)
BUN: 10 mg/dL (ref 6–20)
CO2: 21 mmol/L — ABNORMAL LOW (ref 22–32)
Calcium: 8.8 mg/dL — ABNORMAL LOW (ref 8.9–10.3)
Chloride: 107 mmol/L (ref 98–111)
Creatinine, Ser: 0.58 mg/dL (ref 0.44–1.00)
GFR, Estimated: >60 mL/min (ref >60)
Glucose, Bld: 103 mg/dL — ABNORMAL HIGH (ref 70–99)
Potassium: 4 mmol/L (ref 3.5–5.1)
Sodium: 136 mmol/L (ref 135–145)
Total Bilirubin: 0.3 mg/dL (ref 0.0–1.2)
Total Protein: 6.8 g/dL (ref 6.5–8.1)

## 2024-02-01 LAB — URINE DRUG SCREEN, QUALITATIVE (ARMC ONLY)
Amphetamines, Ur Screen: NOT DETECTED
Barbiturates, Ur Screen: NOT DETECTED
Benzodiazepine, Ur Scrn: NOT DETECTED
Cannabinoid 50 Ng, Ur ~~LOC~~: NOT DETECTED
Cocaine Metabolite,Ur ~~LOC~~: NOT DETECTED
MDMA (Ecstasy)Ur Screen: NOT DETECTED
Methadone Scn, Ur: NOT DETECTED
Opiate, Ur Screen: NOT DETECTED
Phencyclidine (PCP) Ur S: NOT DETECTED
Tricyclic, Ur Screen: NOT DETECTED

## 2024-02-01 LAB — CBC
HCT: 35.4 % — ABNORMAL LOW (ref 36.0–46.0)
Hemoglobin: 11.2 g/dL — ABNORMAL LOW (ref 12.0–15.0)
MCH: 24.3 pg — ABNORMAL LOW (ref 26.0–34.0)
MCHC: 31.6 g/dL (ref 30.0–36.0)
MCV: 76.8 fL — ABNORMAL LOW (ref 80.0–100.0)
Platelets: 262 K/uL (ref 150–400)
RBC: 4.61 MIL/uL (ref 3.87–5.11)
RDW: 15.7 % — ABNORMAL HIGH (ref 11.5–15.5)
WBC: 10.6 K/uL — ABNORMAL HIGH (ref 4.0–10.5)
nRBC: 0 % (ref 0.0–0.2)

## 2024-02-01 LAB — TYPE AND SCREEN
ABO/RH(D): O POS
Antibody Screen: NEGATIVE

## 2024-02-01 LAB — PROTEIN / CREATININE RATIO, URINE
Creatinine, Urine: 276 mg/dL
Protein Creatinine Ratio: 0.13 mg/mg{creat} (ref 0.00–0.15)
Total Protein, Urine: 35 mg/dL

## 2024-02-01 MED ORDER — MAGNESIUM GLUCONATE 500 (27 MG) MG PO TABS
500.0000 mg | ORAL_TABLET | Freq: Every day | ORAL | Status: DC
  Administered 2024-02-01: 500 mg via ORAL
  Filled 2024-02-01 (×2): qty 1

## 2024-02-01 MED ORDER — OXYTOCIN-SODIUM CHLORIDE 30-0.9 UT/500ML-% IV SOLN
2.5000 [IU]/h | INTRAVENOUS | Status: DC
  Administered 2024-02-01: 2.5 [IU]/h via INTRAVENOUS

## 2024-02-01 MED ORDER — SODIUM CHLORIDE 0.9 % IV SOLN
INTRAVENOUS | Status: DC | PRN
  Administered 2024-02-01 (×2): 5 mL via EPIDURAL

## 2024-02-01 MED ORDER — MELATONIN 5 MG PO TABS
10.0000 mg | ORAL_TABLET | Freq: Every day | ORAL | Status: DC
  Filled 2024-02-01 (×2): qty 2

## 2024-02-01 MED ORDER — FLEET ENEMA RE ENEM: 1.0000 | ENEMA | Freq: Every day | RECTAL | Status: DC | PRN

## 2024-02-01 MED ORDER — SIMETHICONE 80 MG PO CHEW: 80.0000 mg | CHEWABLE_TABLET | ORAL | Status: DC | PRN

## 2024-02-01 MED ORDER — LIDOCAINE HCL (PF) 1 % IJ SOLN
INTRAMUSCULAR | Status: AC
  Filled 2024-02-01: qty 30

## 2024-02-01 MED ORDER — ACETAMINOPHEN 325 MG PO TABS: 650.0000 mg | ORAL_TABLET | ORAL | Status: DC | PRN

## 2024-02-01 MED ORDER — SENNOSIDES-DOCUSATE SODIUM 8.6-50 MG PO TABS
2.0000 | ORAL_TABLET | ORAL | Status: DC
  Filled 2024-02-01: qty 2

## 2024-02-01 MED ORDER — BISACODYL 10 MG RE SUPP: 10.0000 mg | Freq: Every day | RECTAL | Status: DC | PRN

## 2024-02-01 MED ORDER — PRENATAL MULTIVITAMIN CH
1.0000 | ORAL_TABLET | Freq: Every day | ORAL | Status: DC
  Administered 2024-02-02: 1 via ORAL
  Filled 2024-02-01 (×2): qty 1

## 2024-02-01 MED ORDER — ACETAMINOPHEN 325 MG PO TABS
650.0000 mg | ORAL_TABLET | ORAL | Status: DC | PRN
  Administered 2024-02-01 – 2024-02-02 (×2): 650 mg via ORAL
  Filled 2024-02-01 (×2): qty 2

## 2024-02-01 MED ORDER — ONDANSETRON HCL 4 MG/2ML IJ SOLN: 4.0000 mg | Freq: Four times a day (QID) | INTRAMUSCULAR | Status: DC | PRN

## 2024-02-01 MED ORDER — LIDOCAINE HCL (PF) 1 % IJ SOLN: 30.0000 mL | INTRAMUSCULAR | Status: DC | PRN

## 2024-02-01 MED ORDER — SODIUM CHLORIDE 0.9% FLUSH
3.0000 mL | Freq: Two times a day (BID) | INTRAVENOUS | Status: DC
  Administered 2024-02-02: 3 mL via INTRAVENOUS

## 2024-02-01 MED ORDER — ESCITALOPRAM OXALATE 10 MG PO TABS
20.0000 mg | ORAL_TABLET | Freq: Every day | ORAL | Status: DC
  Administered 2024-02-02: 20 mg via ORAL
  Filled 2024-02-01: qty 1
  Filled 2024-02-01: qty 2

## 2024-02-01 MED ORDER — COCONUT OIL OIL: 1.0000 | TOPICAL_OIL | Status: DC | PRN

## 2024-02-01 MED ORDER — ONDANSETRON HCL 4 MG PO TABS: 4.0000 mg | ORAL_TABLET | ORAL | Status: DC | PRN

## 2024-02-01 MED ORDER — SODIUM CHLORIDE 0.9% FLUSH: 3.0000 mL | INTRAVENOUS | Status: DC | PRN

## 2024-02-01 MED ORDER — MISOPROSTOL 200 MCG PO TABS
ORAL_TABLET | ORAL | Status: AC
  Filled 2024-02-01: qty 4

## 2024-02-01 MED ORDER — SODIUM CHLORIDE 0.9 % IV SOLN: 1.0000 g | INTRAVENOUS | Status: DC

## 2024-02-01 MED ORDER — OXYTOCIN BOLUS FROM INFUSION
333.0000 mL | Freq: Once | INTRAVENOUS | Status: DC
  Administered 2024-02-01: 333 mL via INTRAVENOUS

## 2024-02-01 MED ORDER — LIDOCAINE-EPINEPHRINE (PF) 1.5 %-1:200000 IJ SOLN
INTRAMUSCULAR | Status: DC | PRN
  Administered 2024-02-01: 3 mL via PERINEURAL

## 2024-02-01 MED ORDER — FENTANYL-BUPIVACAINE-NACL 0.5-0.125-0.9 MG/250ML-% EP SOLN
EPIDURAL | Status: DC | PRN
  Administered 2024-02-01: 12 mL/h via EPIDURAL

## 2024-02-01 MED ORDER — BENZOCAINE-MENTHOL 20-0.5 % EX AERO
1.0000 | INHALATION_SPRAY | CUTANEOUS | Status: DC | PRN
  Filled 2024-02-01: qty 56

## 2024-02-01 MED ORDER — IBUPROFEN 600 MG PO TABS
600.0000 mg | ORAL_TABLET | Freq: Four times a day (QID) | ORAL | Status: DC
  Administered 2024-02-01 – 2024-02-02 (×4): 600 mg via ORAL
  Filled 2024-02-01 (×4): qty 1

## 2024-02-01 MED ORDER — DIPHENHYDRAMINE HCL 25 MG PO CAPS
25.0000 mg | ORAL_CAPSULE | Freq: Four times a day (QID) | ORAL | Status: DC | PRN
Start: 2024-02-01 — End: 2024-02-03

## 2024-02-01 MED ORDER — ZOLPIDEM TARTRATE 5 MG PO TABS: 5.0000 mg | ORAL_TABLET | Freq: Every evening | ORAL | Status: DC | PRN

## 2024-02-01 MED ORDER — ONDANSETRON HCL 4 MG/2ML IJ SOLN: 4.0000 mg | INTRAMUSCULAR | Status: DC | PRN

## 2024-02-01 MED ORDER — MEASLES, MUMPS & RUBELLA VAC IJ SOLR
0.5000 mL | Freq: Once | INTRAMUSCULAR | Status: DC
  Filled 2024-02-01: qty 0.5

## 2024-02-01 MED ORDER — TETANUS-DIPHTH-ACELL PERTUSSIS 5-2.5-18.5 LF-MCG/0.5 IM SUSY
0.5000 mL | PREFILLED_SYRINGE | Freq: Once | INTRAMUSCULAR | Status: DC
  Filled 2024-02-01: qty 0.5

## 2024-02-01 MED ORDER — FENTANYL-BUPIVACAINE-NACL 0.5-0.125-0.9 MG/250ML-% EP SOLN
EPIDURAL | Status: AC
  Filled 2024-02-01: qty 250

## 2024-02-01 MED ORDER — OXYTOCIN 10 UNIT/ML IJ SOLN
INTRAMUSCULAR | Status: AC
  Filled 2024-02-01: qty 2

## 2024-02-01 MED ORDER — SODIUM CHLORIDE 0.9 % IV SOLN
250.0000 mL | INTRAVENOUS | Status: DC | PRN
Start: 2024-02-01 — End: 2024-02-03

## 2024-02-01 MED ORDER — SOD CITRATE-CITRIC ACID 500-334 MG/5ML PO SOLN: 30.0000 mL | ORAL | Status: DC | PRN

## 2024-02-01 MED ORDER — OXYCODONE HCL 5 MG PO TABS: 5.0000 mg | ORAL_TABLET | ORAL | Status: DC | PRN

## 2024-02-01 MED ORDER — LACTATED RINGERS IV SOLN: 500.0000 mL | INTRAVENOUS | Status: DC | PRN

## 2024-02-01 MED ORDER — DIBUCAINE (PERIANAL) 1 % EX OINT
1.0000 | TOPICAL_OINTMENT | CUTANEOUS | Status: DC | PRN
  Filled 2024-02-01: qty 28

## 2024-02-01 MED ORDER — SODIUM CHLORIDE 0.9 % IV SOLN
2.0000 g | Freq: Once | INTRAVENOUS | Status: AC
  Administered 2024-02-01: 2 g via INTRAVENOUS
  Filled 2024-02-01: qty 2000

## 2024-02-01 MED ORDER — WITCH HAZEL-GLYCERIN EX PADS
1.0000 | MEDICATED_PAD | CUTANEOUS | Status: DC | PRN
  Filled 2024-02-01: qty 100

## 2024-02-01 MED ORDER — OXYTOCIN-SODIUM CHLORIDE 30-0.9 UT/500ML-% IV SOLN
INTRAVENOUS | Status: AC
  Filled 2024-02-01: qty 500

## 2024-02-01 MED ORDER — AMMONIA AROMATIC IN INHA
RESPIRATORY_TRACT | Status: AC
  Filled 2024-02-01: qty 10

## 2024-02-01 MED ORDER — LACTATED RINGERS IV SOLN: INTRAVENOUS | Status: DC

## 2024-02-01 NOTE — Anesthesia Preprocedure Evaluation (Signed)
 Anesthesia Evaluation  Patient identified by MRN, date of birth, ID band Patient awake    Reviewed: Allergy & Precautions, NPO status , Patient's Chart, lab work & pertinent test results  Airway Mallampati: III  TM Distance: >3 FB Neck ROM: full    Dental  (+) Chipped   Pulmonary neg pulmonary ROS, former smoker   Pulmonary exam normal        Cardiovascular Exercise Tolerance: Good negative cardio ROS Normal cardiovascular exam     Neuro/Psych  PSYCHIATRIC DISORDERS Anxiety Depression       GI/Hepatic negative GI ROS,,,  Endo/Other    Renal/GU      Musculoskeletal   Abdominal   Peds  Hematology negative hematology ROS (+)   Anesthesia Other Findings Vaping HX of Substance Abuse Anemia  Past Medical History: No date: Depression No date: Prediabetes  History reviewed. No pertinent surgical history.  BMI    Body Mass Index: 33.73 kg/m      Reproductive/Obstetrics (+) Pregnancy                              Anesthesia Physical Anesthesia Plan  ASA: 2  Anesthesia Plan: Epidural   Post-op Pain Management:    Induction:   PONV Risk Score and Plan:   Airway Management Planned: Natural Airway  Additional Equipment:   Intra-op Plan:   Post-operative Plan:   Informed Consent: I have reviewed the patients History and Physical, chart, labs and discussed the procedure including the risks, benefits and alternatives for the proposed anesthesia with the patient or authorized representative who has indicated his/her understanding and acceptance.     Dental Advisory Given  Plan Discussed with:   Anesthesia Plan Comments: (Patient reports no bleeding problems and no anticoagulant use.   Patient consented for risks of anesthesia including but not limited to:  - adverse reactions to medications - risk of bleeding, infection and or nerve damage from epidural that could lead  to paralysis - risk of headache or failed epidural - nerve damage due to positioning - that if epidural is used for C-section that there is a chance of epidural failure requiring spinal placement or conversion to GA - Damage to heart, brain, lungs, other parts of body or loss of life  Patient voiced understanding and assent.)        Anesthesia Quick Evaluation
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
## 2024-02-01 NOTE — Anesthesia Procedure Notes (Signed)
 Epidural Patient location during procedure: OB Start time: 02/01/2024 1:56 PM End time: 02/01/2024 2:00 PM  Staffing Anesthesiologist: Leavy Ned, MD Performed: anesthesiologist   Preanesthetic Checklist Completed: patient identified, IV checked, site marked, risks and benefits discussed, surgical consent, monitors and equipment checked, pre-op evaluation and timeout performed  Epidural Patient position: sitting Prep: Betadine Patient monitoring: heart rate, continuous pulse ox and blood pressure Approach: midline Location: L3-L4 Injection technique: LOR saline  Needle:  Needle type: Tuohy  Needle gauge: 18 G Needle length: 9 cm and 9 Needle insertion depth: 8 cm Catheter type: closed end flexible Catheter size: 20 Guage Catheter at skin depth: 13 cm Test dose: negative and 1.5% lidocaine  with Epi 1:200 K  Assessment Sensory level: T4 Events: blood not aspirated, no cerebrospinal fluid, injection not painful, no injection resistance, no paresthesia and negative IV test  Additional Notes 1 attempt Pt. Evaluated and documentation done after procedure finished. Patient identified. Risks/Benefits/Options discussed with patient including but not limited to bleeding, infection, nerve damage, paralysis, failed block, incomplete pain control, headache, blood pressure changes, nausea, vomiting, reactions to medication both or allergic, itching and postpartum back pain. Confirmed with bedside nurse the patient's most recent platelet count. Confirmed with patient that they are not currently taking any anticoagulation, have any bleeding history or any family history of bleeding disorders. Patient expressed understanding and wished to proceed. All questions were answered. Sterile technique was used throughout the entire procedure. Please see nursing notes for vital signs. Test dose was given through epidural catheter and negative prior to continuing to dose epidural or start infusion.  Warning signs of high block given to the patient including shortness of breath, tingling/numbness in hands, complete motor block, or any concerning symptoms with instructions to call for help. Patient was given instructions on fall risk and not to get out of bed. All questions and concerns addressed with instructions to call with any issues or inadequate analgesia.    Patient tolerated the insertion well without immediate complications. Reason for block:procedure for pain

## 2024-02-01 NOTE — Progress Notes (Signed)
 L&D Note    Subjective:  Resting after epidural  Objective:    Current Vital Signs 24h Vital Sign Ranges  T   No data recorded  BP 114/72 BP  Min: 114/72  Max: 125/79  HR 82 Pulse  Avg: 84.3  Min: 81  Max: 90  RR   No data recorded  SaO2 100 %   SpO2  Avg: 99.5 %  Min: 99 %  Max: 100 %      Gen: alert, cooperative, no distress FHR: Baseline: 125 bpm, Variability: moderate, Accels: Present, Decels: variable Toco: regular, every 3-5 minutes SVE: 9.5/100/+1  Medications SCHEDULED MEDICATIONS   ammonia        lidocaine  (PF)       misoprostol        oxytocin        oxytocin  40 units in LR 1000 mL  333 mL Intravenous Once   Oxytocin -Sodium Chloride         MEDICATION INFUSIONS   ampicillin  (OMNIPEN) IV 2 g (02/01/24 1433)   Followed by   ampicillin  (OMNIPEN) IV     lactated ringers      lactated ringers      oxytocin       PRN MEDICATIONS  acetaminophen , ammonia , lactated ringers , lidocaine  (PF), lidocaine  (PF), misoprostol , ondansetron , oxytocin , Oxytocin -Sodium Chloride , sodium citrate-citric acid    Assessment & Plan:  33 y.o. H5E8978 at [redacted]w[redacted]d admitted for active labor  -Labor: Active phase labor. Patient arrived to L&D in active labor and involuntarily pushing. Pushing attempted but then anterior lip of cervix became more prominent. Contractions were intolerable and epidural attempted and placed by anesthesia. Will promote relaxation and fetal descent with maternal positioning. -Fetal Well-being: Category II - overall reassuring with moderate variability  -GBS: positive - ampicillin  started  -Membranes ruptured spontaneously for clear fluid  -Intervention: change maternal position and epidural for pain management  -Analgesia: regional anesthesia -Dr. Verdon updated on plan of care    Eileen Dudley, CNM  02/01/2024 2:39 PM  [facility]

## 2024-02-01 NOTE — Discharge Instructions (Signed)
 SABRA
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
## 2024-02-02 ENCOUNTER — Other Ambulatory Visit: Payer: Self-pay

## 2024-02-02 ENCOUNTER — Encounter: Payer: Self-pay | Admitting: Obstetrics and Gynecology

## 2024-02-02 LAB — CBC
HCT: 30.9 % — ABNORMAL LOW (ref 36.0–46.0)
Hemoglobin: 9.5 g/dL — ABNORMAL LOW (ref 12.0–15.0)
MCH: 24 pg — ABNORMAL LOW (ref 26.0–34.0)
MCHC: 30.7 g/dL (ref 30.0–36.0)
MCV: 78 fL — ABNORMAL LOW (ref 80.0–100.0)
Platelets: 250 K/uL (ref 150–400)
RBC: 3.96 MIL/uL (ref 3.87–5.11)
RDW: 15.9 % — ABNORMAL HIGH (ref 11.5–15.5)
WBC: 13.4 K/uL — ABNORMAL HIGH (ref 4.0–10.5)
nRBC: 0 % (ref 0.0–0.2)

## 2024-02-02 LAB — RPR: RPR Ser Ql: NONREACTIVE

## 2024-02-02 MED ORDER — ACETAMINOPHEN 325 MG PO TABS: 650.0000 mg | ORAL_TABLET | ORAL | Status: AC | PRN

## 2024-02-02 MED ORDER — SIMETHICONE 80 MG PO CHEW
80.0000 mg | CHEWABLE_TABLET | ORAL | 0 refills | Status: DC | PRN
  Filled 2024-02-02: qty 30, 15d supply, fill #0

## 2024-02-02 MED ORDER — COCONUT OIL OIL: 1.0000 | TOPICAL_OIL | Status: DC | PRN

## 2024-02-02 MED ORDER — WITCH HAZEL-GLYCERIN EX PADS: 1.0000 | MEDICATED_PAD | CUTANEOUS | Status: DC | PRN

## 2024-02-02 MED ORDER — SENNOSIDES-DOCUSATE SODIUM 8.6-50 MG PO TABS
2.0000 | ORAL_TABLET | ORAL | 0 refills | Status: AC
  Filled 2024-02-02: qty 60, 30d supply, fill #0

## 2024-02-02 MED ORDER — IBUPROFEN 600 MG PO TABS
600.0000 mg | ORAL_TABLET | Freq: Four times a day (QID) | ORAL | 0 refills | Status: AC
  Filled 2024-02-02: qty 30, 8d supply, fill #0

## 2024-02-02 MED ORDER — DIBUCAINE (PERIANAL) 1 % EX OINT: 1.0000 | TOPICAL_OINTMENT | CUTANEOUS | Status: DC | PRN

## 2024-02-02 MED ORDER — PRENATAL MULTIVITAMIN CH: 1.0000 | ORAL_TABLET | Freq: Every day | ORAL | Status: AC

## 2024-02-02 MED ORDER — BENZOCAINE-MENTHOL 20-0.5 % EX AERO: 1.0000 | INHALATION_SPRAY | CUTANEOUS | Status: DC | PRN

## 2024-02-02 NOTE — Lactation Note (Signed)
 This note was copied from a baby's chart. Lactation Consultation Note  Patient Name: Boy Kaidance Pantoja Unijb'd Date: 02/02/2024 Age:35 hours Reason for consult: Follow-up assessment;Term;Other (Comment) (Discharge Education)   Maternal Data Follow up assessment w/ a P2 patient and a 21hr old baby boy.    Feeding Mother's Current Feeding Choice: Breast Milk  No feeding observed during this visit.  Interventions Interventions: Breast feeding basics reviewed;Education;Infant Driven Feeding Algorithm education;CDC milk storage guidelines  LC also provided education on how to prevent clogged ducts.   Discharge Discharge Education: Engorgement and breast care;Warning signs for feeding baby;Outpatient recommendation  Education on engorgement prevention/treatment was discussed as well as breastmilk storage guidelines.  LC provided patient with a handout on breastmilk storage guidelines from [facility]. [facility] outpatient lactation services phone number written on the white board in the room.  Patient verbalized understanding.  LC also provided education from the postpartum book about warning signs to look for in a poor feeding.    Consult Status Consult Status: Complete Follow-up type: Call as needed    Giovonni Poirier S Venetta Knee 02/02/2024, 3:36 PM
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
## 2024-02-02 NOTE — Progress Notes (Signed)
 Pt discharged with infant.  Discharge instructions, prescriptions and follow up appointment given to and reviewed with pt. Pt verbalized understanding. Escorted out by auxillary.
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
## 2024-02-03 ENCOUNTER — Other Ambulatory Visit: Payer: Self-pay

## 2024-02-03 NOTE — Anesthesia Postprocedure Evaluation (Signed)
 Anesthesia Post Note  Patient: Eileen Dudley  Procedure(s) Performed: AN AD HOC LABOR EPIDURAL  Patient location during evaluation: [facility] Anesthesia Type: Epidural Level of consciousness: awake and alert Pain management: pain level controlled Vital Signs Assessment: post-procedure vital signs reviewed and stable Respiratory status: spontaneous breathing, nonlabored ventilation, respiratory function stable and patient connected to nasal cannula oxygen Cardiovascular status: blood pressure returned to baseline and stable Postop Assessment: no apparent nausea or vomiting Anesthetic complications: no   No notable events documented.   Last Vitals: There were no vitals filed for this visit.  Last Pain: There were no vitals filed for this visit.               Debby Mines

## 2024-02-03 NOTE — Anesthesia Post-op Follow-up Note (Signed)
  Anesthesia Pain Follow-up Note  Patient: Eileen Dudley  Day #: 1  Date of Follow-up: 02/03/2024 Time: 10:20 AM  Last Vitals: There were no vitals filed for this visit.  Level of Consciousness: alert  Pain: none   Side Effects:None  Catheter Site Exam:clean, dry, no drainage     Plan: D/C from anesthesia care at surgeon's request  Debby Mines

## 2024-02-04 ENCOUNTER — Ambulatory Visit: Payer: MEDICAID

## 2024-02-05 ENCOUNTER — Telehealth: Payer: Self-pay

## 2024-02-05 NOTE — Telephone Encounter (Signed)
 TC to patient to schedule her for 2 PP mood check. LM with number to call. Patient delivered 02/01/24. SABRAIzetta Parish, RN

## 2024-02-09 NOTE — Telephone Encounter (Signed)
 TC to patient to get her scheduled to be seen for a 2 week PP visit, around 7/30 or 7/31. LM with number to call.Izetta Parish, RN

## 2024-02-16 ENCOUNTER — Telehealth: Payer: Self-pay | Admitting: Licensed Clinical Social Worker

## 2024-02-16 NOTE — Telephone Encounter (Signed)
-----   Message from Nurse Katie B sent at 02/04/2024  9:59 AM EDT ----- Regarding: Delivered Eileen Dudley,  This patient delivered on 02/01/24. I think you have seen her before. Just wanted to see if you could reach out to her.   Thanks, [employer]

## 2024-02-17 NOTE — Telephone Encounter (Signed)
 Left voice message for patient to call [facility] Department to schedule postpartum visit. Number provided. Consuelo Kingsley RN

## 2024-02-23 ENCOUNTER — Other Ambulatory Visit: Payer: Self-pay

## 2024-02-23 ENCOUNTER — Observation Stay
Admission: EM | Admit: 2024-02-23 | Discharge: 2024-02-24 | Disposition: A | Discharge status: Home / Self Care | Payer: Self-pay

## 2024-02-23 DIAGNOSIS — O99345 Other mental disorders complicating the puerperium: Secondary | ICD-10-CM | POA: Insufficient documentation

## 2024-02-23 DIAGNOSIS — O9081 Anemia of the puerperium: Secondary | ICD-10-CM | POA: Insufficient documentation

## 2024-02-23 DIAGNOSIS — O1495 Unspecified pre-eclampsia, complicating the puerperium: Secondary | ICD-10-CM | POA: Diagnosis present

## 2024-02-23 DIAGNOSIS — F419 Anxiety disorder, unspecified: Secondary | ICD-10-CM | POA: Diagnosis not present

## 2024-02-23 DIAGNOSIS — D509 Iron deficiency anemia, unspecified: Secondary | ICD-10-CM | POA: Insufficient documentation

## 2024-02-23 DIAGNOSIS — F53 Postpartum depression: Secondary | ICD-10-CM | POA: Insufficient documentation

## 2024-02-23 DIAGNOSIS — F1090 Alcohol use, unspecified, uncomplicated: Secondary | ICD-10-CM

## 2024-02-23 DIAGNOSIS — O1415 Severe pre-eclampsia, complicating the puerperium: Secondary | ICD-10-CM | POA: Diagnosis not present

## 2024-02-23 DIAGNOSIS — R443 Hallucinations, unspecified: Secondary | ICD-10-CM

## 2024-02-23 LAB — COMPREHENSIVE METABOLIC PANEL WITH GFR
ALT: 55 U/L — ABNORMAL HIGH (ref 0–44)
AST: 59 U/L — ABNORMAL HIGH (ref 15–41)
Albumin: 3.3 g/dL — ABNORMAL LOW (ref 3.5–5.0)
Alkaline Phosphatase: 155 U/L — ABNORMAL HIGH (ref 38–126)
Anion gap: 13 (ref 5–15)
BUN: 8 mg/dL (ref 6–20)
CO2: 25 mmol/L (ref 22–32)
Calcium: 8.3 mg/dL — ABNORMAL LOW (ref 8.9–10.3)
Chloride: 103 mmol/L (ref 98–111)
Creatinine, Ser: 0.52 mg/dL (ref 0.44–1.00)
GFR, Estimated: >60 mL/min (ref >60)
Glucose, Bld: 118 mg/dL — ABNORMAL HIGH (ref 70–99)
Potassium: 3.8 mmol/L (ref 3.5–5.1)
Sodium: 141 mmol/L (ref 135–145)
Total Bilirubin: 0.8 mg/dL (ref 0.0–1.2)
Total Protein: 7.2 g/dL (ref 6.5–8.1)

## 2024-02-23 LAB — URINE DRUG SCREEN, QUALITATIVE (ARMC ONLY)
Amphetamines, Ur Screen: NOT DETECTED
Barbiturates, Ur Screen: NOT DETECTED
Benzodiazepine, Ur Scrn: NOT DETECTED
Cannabinoid 50 Ng, Ur ~~LOC~~: NOT DETECTED
Cocaine Metabolite,Ur ~~LOC~~: NOT DETECTED
MDMA (Ecstasy)Ur Screen: NOT DETECTED
Methadone Scn, Ur: NOT DETECTED
Opiate, Ur Screen: NOT DETECTED
Phencyclidine (PCP) Ur S: NOT DETECTED
Tricyclic, Ur Screen: NOT DETECTED

## 2024-02-23 LAB — URINALYSIS, COMPLETE (UACMP) WITH MICROSCOPIC
Bilirubin Urine: NEGATIVE
Glucose, UA: NEGATIVE mg/dL
Ketones, ur: NEGATIVE mg/dL
Leukocytes,Ua: NEGATIVE
Nitrite: NEGATIVE
Protein, ur: 100 mg/dL — AB
Specific Gravity, Urine: 1.015 (ref 1.005–1.030)
pH: 8 (ref 5.0–8.0)

## 2024-02-23 LAB — CBC WITH DIFFERENTIAL/PLATELET
Abs Immature Granulocytes: 0.03 K/uL (ref 0.00–0.07)
Basophils Absolute: 0.1 K/uL (ref 0.0–0.1)
Basophils Relative: 1 %
Eosinophils Absolute: 0 K/uL (ref 0.0–0.5)
Eosinophils Relative: 0 %
HCT: 36.2 % (ref 36.0–46.0)
Hemoglobin: 10.9 g/dL — ABNORMAL LOW (ref 12.0–15.0)
Immature Granulocytes: 0 %
Lymphocytes Relative: 22 %
Lymphs Abs: 1.6 K/uL (ref 0.7–4.0)
MCH: 23.9 pg — ABNORMAL LOW (ref 26.0–34.0)
MCHC: 30.1 g/dL (ref 30.0–36.0)
MCV: 79.2 fL — ABNORMAL LOW (ref 80.0–100.0)
Monocytes Absolute: 0.5 K/uL (ref 0.1–1.0)
Monocytes Relative: 7 %
Neutro Abs: 5 K/uL (ref 1.7–7.7)
Neutrophils Relative %: 70 %
Platelets: 423 K/uL — ABNORMAL HIGH (ref 150–400)
RBC: 4.57 MIL/uL (ref 3.87–5.11)
RDW: 21 % — ABNORMAL HIGH (ref 11.5–15.5)
WBC: 7.2 K/uL (ref 4.0–10.5)
nRBC: 0 % (ref 0.0–0.2)

## 2024-02-23 LAB — ACETAMINOPHEN LEVEL: Acetaminophen (Tylenol), Serum: <10 ug/mL — ABNORMAL LOW (ref 10–30)

## 2024-02-23 LAB — MAGNESIUM: Magnesium: 1.8 mg/dL (ref 1.7–2.4)

## 2024-02-23 LAB — PROTEIN / CREATININE RATIO, URINE
Creatinine, Urine: 76 mg/dL
Protein Creatinine Ratio: 0.5 mg/mg{creat} — ABNORMAL HIGH (ref 0.00–0.15)
Total Protein, Urine: 38 mg/dL

## 2024-02-23 LAB — IRON AND TIBC
Iron: 72 ug/dL (ref 28–170)
Saturation Ratios: 14 % (ref 10.4–31.8)
TIBC: 504 ug/dL — ABNORMAL HIGH (ref 250–450)
UIBC: 432 ug/dL

## 2024-02-23 LAB — VITAMIN D 25 HYDROXY (VIT D DEFICIENCY, FRACTURES): Vit D, 25-Hydroxy: 20.55 ng/mL — ABNORMAL LOW (ref 30–100)

## 2024-02-23 LAB — SALICYLATE LEVEL: Salicylate Lvl: <7 mg/dL — ABNORMAL LOW (ref 7.0–30.0)

## 2024-02-23 LAB — FERRITIN: Ferritin: 15 ng/mL (ref 11–307)

## 2024-02-23 LAB — VITAMIN B12: Vitamin B-12: 668 pg/mL (ref 180–914)

## 2024-02-23 LAB — ETHANOL: Alcohol, Ethyl (B): <15 mg/dL (ref <15)

## 2024-02-23 LAB — TSH: TSH: 1.583 u[IU]/mL (ref 0.350–4.500)

## 2024-02-23 LAB — HCG, QUANTITATIVE, PREGNANCY: hCG, Beta Chain, Quant, S: 9 m[IU]/mL — ABNORMAL HIGH (ref <5)

## 2024-02-23 MED ORDER — NIFEDIPINE ER OSMOTIC RELEASE 30 MG PO TB24
30.0000 mg | ORAL_TABLET | Freq: Every day | ORAL | Status: DC
  Administered 2024-02-23 – 2024-02-24 (×2): 30 mg via ORAL
  Filled 2024-02-23 (×2): qty 1

## 2024-02-23 MED ORDER — IBUPROFEN 600 MG PO TABS
600.0000 mg | ORAL_TABLET | Freq: Four times a day (QID) | ORAL | Status: DC | PRN
  Administered 2024-02-24: 600 mg via ORAL
  Filled 2024-02-23: qty 1

## 2024-02-23 MED ORDER — KETOROLAC TROMETHAMINE 30 MG/ML IJ SOLN
30.0000 mg | Freq: Once | INTRAMUSCULAR | Status: AC
  Administered 2024-02-23: 30 mg via INTRAVENOUS
  Filled 2024-02-23: qty 1

## 2024-02-23 MED ORDER — DOXYCYCLINE HYCLATE 100 MG PO TABS
100.0000 mg | ORAL_TABLET | Freq: Two times a day (BID) | ORAL | Status: DC
Start: 2024-02-24 — End: 2024-02-29
  Administered 2024-02-24: 100 mg via ORAL
  Filled 2024-02-23 (×2): qty 1

## 2024-02-23 MED ORDER — NICOTINE 14 MG/24HR TD PT24
14.0000 mg | MEDICATED_PATCH | Freq: Every day | TRANSDERMAL | Status: DC
  Administered 2024-02-23 – 2024-02-24 (×2): 14 mg via TRANSDERMAL
  Filled 2024-02-23 (×2): qty 1

## 2024-02-23 MED ORDER — ESCITALOPRAM OXALATE 20 MG PO TABS
20.0000 mg | ORAL_TABLET | Freq: Every day | ORAL | Status: DC
  Administered 2024-02-23 – 2024-02-24 (×2): 20 mg via ORAL
  Filled 2024-02-23: qty 1
  Filled 2024-02-23: qty 2

## 2024-02-23 MED ORDER — THIAMINE HCL 100 MG/ML IJ SOLN
100.0000 mg | Freq: Once | INTRAMUSCULAR | Status: AC
  Administered 2024-02-23: 100 mg via INTRAVENOUS
  Filled 2024-02-23: qty 2

## 2024-02-23 MED ORDER — LORAZEPAM 2 MG/ML IJ SOLN
1.0000 mg | Freq: Once | INTRAMUSCULAR | Status: AC
  Administered 2024-02-23: 1 mg via INTRAVENOUS
  Filled 2024-02-23: qty 1

## 2024-02-23 MED ORDER — LORAZEPAM 2 MG/ML IJ SOLN: 1.0000 mg | Freq: Once | INTRAMUSCULAR | Status: DC | PRN

## 2024-02-23 MED ORDER — PRENATAL MULTIVITAMIN CH
1.0000 | ORAL_TABLET | Freq: Every day | ORAL | Status: DC
  Filled 2024-02-23: qty 1

## 2024-02-23 MED ORDER — HYDROXYZINE HCL 25 MG PO TABS
25.0000 mg | ORAL_TABLET | Freq: Three times a day (TID) | ORAL | Status: DC | PRN
  Administered 2024-02-23: 25 mg via ORAL
  Filled 2024-02-23: qty 1

## 2024-02-23 MED ORDER — ONDANSETRON HCL 4 MG/2ML IJ SOLN
4.0000 mg | Freq: Once | INTRAMUSCULAR | Status: AC
  Administered 2024-02-23: 4 mg via INTRAVENOUS
  Filled 2024-02-23: qty 2

## 2024-02-23 MED ORDER — COCONUT OIL OIL: 1.0000 | TOPICAL_OIL | Status: DC | PRN

## 2024-02-23 MED ORDER — MAGNESIUM GLUCONATE 500 (27 MG) MG PO TABS
500.0000 mg | ORAL_TABLET | Freq: Every day | ORAL | Status: DC
  Administered 2024-02-23: 500 mg via ORAL
  Filled 2024-02-23: qty 1

## 2024-02-23 MED ORDER — SODIUM CHLORIDE 0.9 % IV BOLUS (SEPSIS)
1000.0000 mL | Freq: Once | INTRAVENOUS | Status: AC
  Administered 2024-02-23: 1000 mL via INTRAVENOUS

## 2024-02-23 MED ORDER — DOXYCYCLINE HYCLATE 100 MG PO TABS
100.0000 mg | ORAL_TABLET | Freq: Two times a day (BID) | ORAL | Status: DC
  Administered 2024-02-23: 100 mg via ORAL
  Filled 2024-02-23: qty 1

## 2024-02-23 MED ORDER — HYDROXYZINE HCL 25 MG PO TABS
50.0000 mg | ORAL_TABLET | Freq: Three times a day (TID) | ORAL | Status: DC | PRN
  Administered 2024-02-23: 50 mg via ORAL
  Filled 2024-02-23: qty 2

## 2024-02-23 NOTE — ED Notes (Signed)
 The pt states that recently since giving birth she has been experiencing post-partum depression and psychosis. She states that she has been having visual and auditory hallucinations and even though she knows its not real she can't help it from happening.  The pt further states she has been having difficulty sleeping and is unsure of when she got actual rest.  She further states that she is very very fearful of everything.

## 2024-02-23 NOTE — ED Provider Notes (Signed)
 Patient received in signout from Dr. Neomi.  3 weeks postpartum with borderline elevated hypertension.  No signs of sepsis.  Improving symptoms with IV Ativan .  She is very Adult nurse.    LFTs mildly elevated, possibly related to ethanol abuse versus preeclampsia.  Add on urine protein creatinine ratio which is elevated so I consult with OB/GYN who evaluates the patient's.  No severe features.  Consult psychiatry MD who sees the patient and offered inpatient psychiatric admission but patient declines.  Ultimately admit the patient to mother-baby for observation with OB/GYN.  Clinical Course as of 02/23/24 1355  Mon Feb 23, 2024  1053 I consult with ROSARIO LAMY Midwife [DS]    Clinical Course User Index [DS] Claudene Rover, MD       Claudene Rover, MD 02/23/24 [phone]
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
## 2024-02-23 NOTE — ED Notes (Signed)
 Called lab to send phlebotomist to draw blood.

## 2024-02-23 NOTE — ED Notes (Signed)
 Called CCMD to place patient on monitor

## 2024-02-23 NOTE — ED Triage Notes (Signed)
 Pt is coming from home with medic, She is having a mental break down after delivering the baby only July 13th, she has been steadily drinking since the baby, She did not illicit any SI or HI. She told medic that she is  going to flip shit . She is is all over the place and is very agitated at this time but will cooperative with the triage process. She is also hypertensive, and tachycardic.  Medic vitals   170/103 99%ra 90hr 20rr
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
## 2024-02-23 NOTE — ED Notes (Signed)
 Per Dr. Neomi, hold on dressing out patient into paper scrubs at this time.
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
## 2024-02-24 ENCOUNTER — Telehealth: Payer: Self-pay | Admitting: Licensed Clinical Social Worker

## 2024-02-24 ENCOUNTER — Other Ambulatory Visit: Payer: Self-pay

## 2024-02-24 ENCOUNTER — Telehealth: Payer: Self-pay

## 2024-02-24 LAB — COMPREHENSIVE METABOLIC PANEL WITH GFR
ALT: 51 U/L — ABNORMAL HIGH (ref 0–44)
AST: 53 U/L — ABNORMAL HIGH (ref 15–41)
Albumin: 3.2 g/dL — ABNORMAL LOW (ref 3.5–5.0)
Alkaline Phosphatase: 145 U/L — ABNORMAL HIGH (ref 38–126)
Anion gap: 10 (ref 5–15)
BUN: 9 mg/dL (ref 6–20)
CO2: 27 mmol/L (ref 22–32)
Calcium: 8.6 mg/dL — ABNORMAL LOW (ref 8.9–10.3)
Chloride: 103 mmol/L (ref 98–111)
Creatinine, Ser: 0.75 mg/dL (ref 0.44–1.00)
GFR, Estimated: >60 mL/min (ref >60)
Glucose, Bld: 105 mg/dL — ABNORMAL HIGH (ref 70–99)
Potassium: 3.8 mmol/L (ref 3.5–5.1)
Sodium: 140 mmol/L (ref 135–145)
Total Bilirubin: 0.9 mg/dL (ref 0.0–1.2)
Total Protein: 6.9 g/dL (ref 6.5–8.1)

## 2024-02-24 LAB — CBC
HCT: 37.4 % (ref 36.0–46.0)
Hemoglobin: 11.4 g/dL — ABNORMAL LOW (ref 12.0–15.0)
MCH: 24.4 pg — ABNORMAL LOW (ref 26.0–34.0)
MCHC: 30.5 g/dL (ref 30.0–36.0)
MCV: 80.1 fL (ref 80.0–100.0)
Platelets: 422 K/uL — ABNORMAL HIGH (ref 150–400)
RBC: 4.67 MIL/uL (ref 3.87–5.11)
RDW: 21.3 % — ABNORMAL HIGH (ref 11.5–15.5)
WBC: 5.1 K/uL (ref 4.0–10.5)
nRBC: 0 % (ref 0.0–0.2)

## 2024-02-24 LAB — URINE CULTURE: Culture: 90000 — AB

## 2024-02-24 MED ORDER — NIFEDIPINE ER 30 MG PO TB24
30.0000 mg | ORAL_TABLET | Freq: Every day | ORAL | 1 refills | Status: AC
  Filled 2024-02-24: qty 30, 30d supply, fill #0
  Filled 2024-03-17: qty 30, 30d supply, fill #1

## 2024-02-24 MED ORDER — DOXYCYCLINE HYCLATE 100 MG PO TABS
100.0000 mg | ORAL_TABLET | Freq: Two times a day (BID) | ORAL | 0 refills | Status: AC
  Filled 2024-02-24: qty 8, 4d supply, fill #0

## 2024-02-24 MED ORDER — HYDROXYZINE HCL 50 MG PO TABS
50.0000 mg | ORAL_TABLET | Freq: Three times a day (TID) | ORAL | 0 refills | Status: AC | PRN
  Filled 2024-02-24: qty 30, 10d supply, fill #0

## 2024-02-24 NOTE — Telephone Encounter (Signed)
-----   Message from Therisa CHRISTELLA Pillow sent at 02/24/2024  8:21 AM EDT ----- Regarding: RE: RICK Bouillon added Danielle because she's going to be seeing her today. We'll make sure she has an appt with you before discharge. We'll also see if the home postpartum nurse can see her too. Does she need a new referral to [facility] or should we just call and try to schedule?  Therisa ----- Message ----- From: Ellender Palma, KEN Sent: 02/23/2024   2:51 PM EDT To: Therisa CHRISTELLA Pillow, CNM Subject: RE: RICK                                        Thank you, Therisa. I have been trying to reach her with no response. I will try again tomorrow. She is not connected with med management, we have tried to get her to be seen by [facility] and was referred, but would not follow through. If possible can you encourage her to schedule with me and with [facility]? I appreciate you so much!  Kind regards,  Palma ----- Message ----- From: Pillow Therisa CHRISTELLA, CNM Sent: 02/23/2024   2:24 PM EDT To: Palma Ellender, LCSW Subject: Katisha, Shimizu presented to the ED at [facility] on 02/23/2024 with concerns about worsening depression/anxiety symptoms. She was afraid she was getting postpartum psychosis because she hasn't been able to sleep and was starting to hallucinate. She did not meet criteria for an IVC and declined inpatient admission. She did stay for observation overnight in the birth place for postpartum preeclampsia. Her thyroid  was normal and labs are pending for Vitamin D  and B12. I'm hoping she'll do better with some solid sleep but she will need close outpatient follow up. I also think she needs to be with a psychiatrist for medication management if she's not already established (I can't tell from Epic).    I'll message [facility] OB/GYN department to let them know too to make sure she's getting follow up.    She should be her tonight and in the morning tomorrow (8/5). Please let me know if I can do  anything to help further coordinate care for her.   Thank you,  Therisa

## 2024-02-24 NOTE — Plan of Care (Signed)
 ?  Problem: Education: ?Goal: Knowledge of General Education information will improve ?Description: Including pain rating scale, medication(s)/side effects and non-pharmacologic comfort measures ?Outcome: Progressing ?  ?Problem: Health Behavior/Discharge Planning: ?Goal: Ability to manage health-related needs will improve ?Outcome: Progressing ?  ?Problem: Coping: ?Goal: Level of anxiety will decrease ?Outcome: Progressing ?  ?

## 2024-02-24 NOTE — TOC Progression Note (Signed)
 Transition of Care [facility]) - Progression Note    Patient Details  Name: Eileen Dudley MRN: [id number] Date of Birth: [birth year]  [facility] of [facility]) CM/SW Contact  Corrie JINNY Ruts, LCSW Phone Number: 02/24/2024, 2:09 PM  Clinical Narrative:    Chart reviewed. I attempted to go speak with the patient for any other needs. The patient was discharged before consult could have been completed.                     Expected Discharge Plan and Services         Expected Discharge Date: 02/24/24                                     Social Drivers of Health (SDOH) Interventions SDOH Screenings   Food Insecurity: No Food Insecurity (02/23/2024)  Housing: Low Risk  (02/23/2024)  Transportation Needs: No Transportation Needs (02/23/2024)  Utilities: Not At Risk (02/23/2024)  Depression (PHQ2-9): Medium Risk (01/16/2024)  Financial Resource Strain: Low Risk  (11/03/2023)  Social Connections: Unknown (11/03/2023)  Tobacco Use: Medium Risk (02/23/2024)    Readmission Risk Interventions     No data to display
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
## 2024-02-24 NOTE — Telephone Encounter (Signed)
 TC from midwife at [facility] to get patient scheudled for mood and BP check. Patient is overbooked on Friday at 11:00. If patient doesn't show up, hopefully provider can reach out virtually. Hulan Parish, RN

## 2024-02-24 NOTE — Discharge Summary (Signed)
 Postpartum Discharge Summary  Patient Name: Eileen Dudley DOB: [birth year] MRN: [id number]  Date of admission: 02/23/2024 Delivery date: 02/01/24 Delivering provider: Dr. Heather Penton Date of discharge: 02/24/2024  Primary OB: [facility] LMP:No LMP recorded. EDC Estimated Date of Delivery: None noted. Gestational Age at Delivery: Unknown   Admitting diagnosis: Hallucinations [R44.3] Anxiety [F41.9] Preeclampsia in postpartum period [O14.95] Alcohol use disorder [F10.90] Intrauterine pregnancy: Unknown     Secondary diagnosis:   Principal Problem:   Preeclampsia in postpartum period   Discharge Diagnosis: postpartum anxiety, pre-eclampsia in the postpartum period                         Hospital course: she came into the hospital reporting anxiety, hallucinations, and depression, concerned for postpartum psychosis. She was also found to have postpartum pre-eclampsia. She was seen by psych and social work and financial aid/Medicaid. She received Hydroxyzine  for her anxiety and Nifedipine  for her BP. She did not meet criteria for magnesium  sulfate. Appropriate outpatient follow-up was set-up for her and she was stable to be discharged home.   Physical exam  Vitals:   02/24/24 0250 02/24/24 0615 02/24/24 0902 02/24/24 1105  BP: 135/83 130/72 (!) 134/91 138/88  Pulse: 92 88 96 (!) 104  Resp: 16 16    Temp: 97.9 F (36.6 C) 97.9 F (36.6 C)    TempSrc: Oral Oral    SpO2:      Weight:      Height:       General: alert, cooperative, and no distress Lochia: appropriate DVT Evaluation: No evidence of DVT seen on physical exam.  Labs: Lab Results  Component Value Date   WBC 5.1 02/24/2024   HGB 11.4 (L) 02/24/2024   HCT 37.4 02/24/2024   MCV 80.1 02/24/2024   PLT 422 (H) 02/24/2024      Latest Ref Rng & Units 02/24/2024    7:16 AM  CMP  Glucose 70 - 99 mg/dL 894   BUN 6 - 20 mg/dL 9   Creatinine 9.55 - 8.99 mg/dL 9.24   Sodium 864 - 854 mmol/L 140   Potassium 3.5 -  5.1 mmol/L 3.8   Chloride 98 - 111 mmol/L 103   CO2 22 - 32 mmol/L 27   Calcium 8.9 - 10.3 mg/dL 8.6   Total Protein 6.5 - 8.1 g/dL 6.9   Total Bilirubin 0.0 - 1.2 mg/dL 0.9   Alkaline Phos 38 - 126 U/L 145   AST 15 - 41 U/L 53   ALT 0 - 44 U/L 51    Edinburgh Score:    02/02/2024    1:22 PM  Edinburgh Postnatal Depression Scale Screening Tool  I have been able to laugh and see the funny side of things. 1  I have looked forward with enjoyment to things. 1  I have blamed myself unnecessarily when things went wrong. 1  I have been anxious or worried for no good reason. 1  I have felt scared or panicky for no good reason. 2  Things have been getting on top of me. 2  I have been so unhappy that I have had difficulty sleeping. 2  I have felt sad or miserable. 2  I have been so unhappy that I have been crying. 1  The thought of harming myself has occurred to me. 0  Edinburgh Postnatal Depression Scale Total 13    Risk assessment for postpartum VTE and prophylactic treatment: Very high risk factors: None High  risk factors: None Moderate risk factors: BMI 30-40 kg/m2  Postpartum VTE prophylaxis with LMWH not indicated  After visit meds:  Allergies as of 02/24/2024   No Known Allergies      Medication List     TAKE these medications    acetaminophen  325 MG tablet Commonly known as: Tylenol  Take 2 tablets (650 mg total) by mouth every 4 (four) hours as needed (for pain scale < 4).   benzocaine -Menthol  20-0.5 % Aero Commonly known as: DERMOPLAST Apply 1 Application topically as needed for irritation (perineal discomfort).   coconut oil Oil Apply 1 Application topically as needed.   dibucaine 1 % Oint Commonly known as: NUPERCAINAL Place 1 Application rectally as needed for hemorrhoids (if tucks not working).   diphenhydrAMINE  25 mg capsule Commonly known as: BENADRYL  Take 25 mg by mouth at bedtime as needed (insomnia).   doxycycline  100 MG tablet Commonly known as:  VIBRA -TABS Take 1 tablet (100 mg total) by mouth every 12 (twelve) hours for 4 days.   escitalopram  20 MG tablet Commonly known as: Lexapro  Take 1 tablet (20 mg total) by mouth daily.   hydrOXYzine  50 MG tablet Commonly known as: ATARAX  Take 1 tablet (50 mg total) by mouth every 8 (eight) hours as needed for anxiety.   ibuprofen  600 MG tablet Commonly known as: ADVIL  Take 1 tablet (600 mg total) by mouth every 6 (six) hours.   magnesium  gluconate 500 (27 Mg) MG Tabs tablet Commonly known as: MAGONATE Take 500 mg by mouth at bedtime.   Melatonin 5 MG Caps Take 10 mg by mouth at bedtime.   mupirocin ointment 2 % Commonly known as: BACTROBAN Apply 1 Application topically 3 (three) times daily.   NIFEdipine  30 MG 24 hr tablet Commonly known as: ADALAT  CC Take 1 tablet (30 mg total) by mouth daily. Start taking on: February 25, 2024   prenatal multivitamin Tabs tablet Take 1 tablet by mouth daily at 12 noon.   simethicone  80 MG chewable tablet Commonly known as: MYLICON Chew 1 tablet (80 mg total) by mouth as needed for flatulence.   Stool Softener/Laxative 50-8.6 MG tablet Generic drug: senna-docusate Take 2 tablets by mouth daily.   witch hazel-glycerin  pad Commonly known as: TUCKS Apply 1 Application topically as needed for hemorrhoids (for pain).       Discharge home in stable condition Activity: Advance as tolerated. Pelvic rest for 6 weeks.  Diet: routine diet Postpartum Appointment:6 weeks Additional Postpartum F/U: Postpartum Depression checkup and BP check 2-3 days Future Appointments: Future Appointments  Date Time Provider Department Center  02/25/2024  4:00 PM Ellender Palma, LCSW [facility] None  02/27/2024 11:00 AM [facility] PROVIDER [facility] None   Follow up Visit:  Follow-up Information     [facility] DEPARTMENT Follow up in 1 day(s).   Why: you have a virtual counseling appointment with Palma Ellender at [facility] on Wednesday February 25, 2024 Contact  information: [address] [place]  [address]        [facility] HEALTH DEPT Follow up in 3 day(s).   Why: you have a BP and mood check scheduled with the perinatal nurses in maternity at the health department on Friday February 27, 2024 at [facility] information: [address] [place]  [id number] [phone]                Plan:  Cynthya Yam was discharged to home in good condition. Follow-up  appointment as directed.    Signed:  Edsel Charlies Blush, CNM 02/24/2024 12:23 PM

## 2024-02-25 ENCOUNTER — Ambulatory Visit: Payer: Self-pay | Admitting: Licensed Clinical Social Worker

## 2024-02-25 DIAGNOSIS — F331 Major depressive disorder, recurrent, moderate: Secondary | ICD-10-CM | POA: Diagnosis not present

## 2024-02-25 DIAGNOSIS — F411 Generalized anxiety disorder: Secondary | ICD-10-CM

## 2024-02-25 NOTE — Progress Notes (Signed)
 Counselor/Therapist Progress Note  Patient ID: Eileen Dudley, MRN: [id number],    Date: 02/25/2024  Time Spent: 50 total minutes. I spent 45 minutes on real-time audio and video with the patient on the date of service. I spent an additional 5  minutes on pre- and post-visit activities on the date of service including collateral, chart review, team discussion, and documentation.     Treatment Type: Individual Therapy  Reported Symptoms: Anxiety, anxious thoughts, overwhelm, racing thoughts; low mood but denies feeling depressed   Mental Status Exam:  Appearance:   Casual     Behavior:  Appropriate, Sharing, and Motivated  Motor:  Normal  Speech/Language:   Clear and Coherent and Normal Rate  Affect:  Appropriate and Congruent  Mood:  dysthymic  Thought process:  normal  Thought content:    WNL  Sensory/Perceptual disturbances:    WNL  Orientation:  oriented to person, place, time/date, situation, and day of week  Attention:  Good  Concentration:  Good  Memory:  WNL  Fund of knowledge:   Good  Insight:    Fair  Judgment:   Fair  Impulse Control:  Fair   Risk Assessment: Danger to Self:  No Self-injurious Behavior: No Danger to Others: No Duty to Warn:no Physical Aggression / Violence:No  Access to Firearms a concern: No  Gang Involvement:No   Subjective: The patient presents stating she is not as bad as she was. She reports that her partner is currently staying home to support her and will remain home through at least Friday. She was able to get some sleep last night; however, it was interrupted due to her two-year-old being sick. She endorses significant anxiety symptoms, including feelings of overwhelm, difficulty controlling worries, persistent worry, irritability, and sleep disturbance. She reports experiencing some benefit from Hydroxyzine . Patient voices an understanding of the importance of taking care of her-self, including eating, drinking fluids and sleep and  leaning on her partner for sleep support.   The patient discussed what she believes led to her recent hospitalization, primarily difficulties adjusting to caring for both her newborn and two-year-old since delivery on February 01, 2024. She reports feeling highly irritable and overwhelmed. Her infant has colic and cries frequently, which has severely impacted her ability to sleep. She describes feeling solely responsible for everything and expresses a strong sense of inadequacy, stating that if she can't manage it all, she feels like a "shitty mom." She is also struggling to find balance between spending quality time with her two-year-old and providing enough skin-to-skin contact with the baby. The patient had been breastfeeding but made the decision to stop due to the impact on her mental health. While she feels some sadness about this, she acknowledges it was the best decision for her overall well-being.  ASSESSMENT: Patient presents about 3 weeks postpartum experiencing significant symptoms of anxiety and adjustment difficulties following the recent birth of her infant. Her symptoms include emotional overwhelm, irritability, persistent worry, disrupted sleep, and feelings of inadequacy in her role as a mother. These symptoms are exacerbated by the demands of caring for both a newborn with colic and a two-year-old, and by disrupted sleep and limited time for self-care. She reports some benefit from Hydroxyzine  and has insight into the importance of prioritizing her mental health, including her decision to stop breastfeeding. Her partner's increased involvement appears to be a protective factor.  Current presentation is consistent with a diagnosis of Postpartum Anxiety. While she reports some improvement from her previous state, her  functioning remains impacted. Continued monitoring and support are indicated, along with reinforcement of coping strategies and exploration of longer-term treatment options as  needed.  STRENGTHS (Protective Factors/Coping Skills): Patient's insight, willingness to seek help, supportive partner, and has agreed to follow up with [facility] for a psychiatric medication evaluation.   INTERVENTIONS: Interventions utilized:  Motivational Interviewing, Supportive Counseling, Psychoeducation and/or Health Education, Link to [employer], and brief assessment  Standardized Assessments completed: Patient shared she was tired of doing the same screenings   Conducted brief assessment  Provide brief psychoeducation on postpartum mood and anxiety disorders signs and symptoms, including information on [employer], Postpartum Depression, and Postpartum Anxiety.   Discussed self-care strategies to prevent and/or reduce symptoms of postpartum mood and anxiety disorders, including sleep hygiene, eating regularly, drinking fluids, getting outside, and support from partner, family, and/or friends.   Informed patient to call her provider right away or get emergency help if she experiences any of the following symptoms: Feelings of hopelessness and total despair. Feeling out of touch with reality (hearing or seeing things other people don't). Feeling that you might hurt yourself or your baby.  LCSW provided support through active listening, validation of feelings, and highlighted patient's strengths.  PLAN: Patient to schedule appointment with [facility] for Medication Management.   Future Appointments  Date Time Provider Department Center  02/27/2024 11:00 AM [facility] PROVIDER [facility] None  03/02/2024  4:00 PM Ellender Palma, LCSW [facility] None    Diagnosis:   ICD-10-CM   1. Major depressive disorder, recurrent episode, moderate (HCC)  F33.1     2. Generalized anxiety disorder  F41.1      Palma Ellender, LCSW

## 2024-02-27 ENCOUNTER — Encounter: Payer: Self-pay | Admitting: Family Medicine

## 2024-02-27 ENCOUNTER — Ambulatory Visit: Payer: Self-pay | Admitting: Family Medicine

## 2024-02-27 DIAGNOSIS — O1490 Unspecified pre-eclampsia, unspecified trimester: Secondary | ICD-10-CM

## 2024-02-27 DIAGNOSIS — O1495 Unspecified pre-eclampsia, complicating the puerperium: Secondary | ICD-10-CM

## 2024-02-27 DIAGNOSIS — F531 Puerperal psychosis: Secondary | ICD-10-CM

## 2024-02-27 DIAGNOSIS — F411 Generalized anxiety disorder: Secondary | ICD-10-CM

## 2024-02-27 DIAGNOSIS — F331 Major depressive disorder, recurrent, moderate: Secondary | ICD-10-CM

## 2024-02-27 HISTORY — DX: Unspecified pre-eclampsia, unspecified trimester: O14.90

## 2024-02-27 NOTE — Assessment & Plan Note (Signed)
 Hospitalized 8/04 for hallucinations and anxiety. Reports feeling somewhat better now. Is taking her lexapro , hydroxyzine , and nifedipine . See associated A&P for more. Will return for full 6 week postpartum check.

## 2024-02-27 NOTE — Assessment & Plan Note (Signed)
 Diagnosed when hospitalized for postpartum hallucinations 8/04. Started on nifedipine  XR 30 mg tablet daily. BP here today normal. Patient without HA, vision changes, significant swelling of hands or feet. Tolerating nifedipine  well, no complaints. Currently has no home BP cuff and is uninsured, will provide BP cuff today from [facility] stock. Patient provided with pre-eclampsia hand out and BP log. Will return for 6 week postpartum check, or sooner if problems arise.

## 2024-02-27 NOTE — Patient Instructions (Signed)
 Check out the [employer] for Children They have great parenting programs and support groups  Resource regarding exposures that could affect pregnancy or breast feeding: [employer] is a Dentist with lots of information on the effects of many medications and exposures on your pregnancy. It is free to use, including their web site, phone line, text service, app, or email and live chat.  http://golden-thomas.org/ Email or live chat: MotherToBaby.org Phone: [phone] Text: [phone] App: search [employer]   Maternal Mental Health If you start to develop the below symptoms of depression, please reach out to us  for an appointment. There is also a Biomedical scientist Health Hotline at [phone]). This hotline has trained counselors, doulas, and midwifes to real-time support, information, and resources.  Feeling sad or hopeless most of the time Lack of interest in things you used to enjoy Less interest in caring for yourself (dressing, fixing hair) Trouble concentrating Trouble coping with daily tasks Constant worry about your baby Sleeping or eating too much or too little Feeling very anxious or nervous Unexplained irritability or anger Unwanted or scary thoughts Feeling that you are not a good mother Thoughts of hurting yourself or your baby  If you feel you are experiencing a mental health crisis, please reach out to the National Suicide Prevention Hotline at 1-800-273-TALK (647)438-9838).

## 2024-02-27 NOTE — Assessment & Plan Note (Signed)
 Hospitalized 8/04 for hallucinations and anxiety. Has been on Lexapro  20 mg for several months. Started on hydroxyzine  for anxiety during hospitalization, but not taking regularly due to associated drowsiness. Had zoom appointment with our LCSW counselor Alan 8/06 and has a virtual new patient appointment with the [facility] perinatal mood disorders program on Tuesday 8/12. She feels as though some medication adjustments are needed. We discussed waiting until her 8/12 appointment, in case her new psychiatrist wants to change medications. Provided national maternal mental health hotline and flyer on [facility] behavioral health clinic here in town.
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
## 2024-02-27 NOTE — Progress Notes (Addendum)
 Here for PP mood and BP check. States she has an appointment with [facility] perinatal mood clinic on Tuesday.Hulan Parish, RN   Patient given BP cuff #17. No insurance. Extra cuff 22-42 cm given.Izetta Parish, RN

## 2024-03-02 ENCOUNTER — Ambulatory Visit: Payer: Self-pay | Admitting: Licensed Clinical Social Worker

## 2024-03-02 DIAGNOSIS — F411 Generalized anxiety disorder: Secondary | ICD-10-CM | POA: Diagnosis not present

## 2024-03-02 DIAGNOSIS — F331 Major depressive disorder, recurrent, moderate: Secondary | ICD-10-CM | POA: Diagnosis not present

## 2024-03-02 NOTE — Progress Notes (Signed)
 Counselor/Therapist Progress Note  Patient ID: Eileen Dudley, MRN: [id number],    Date: 03/02/2024  Time Spent: 50 total minutes. I spent 45 minutes on real-time audio with the patient on the date of service. I spent an additional 5  minutes on pre- and post-visit activities on the date of service including collateral, chart review, team discussion, and documentation.     Treatment Type: Individual Therapy  Reported Symptoms: Anxiety, anxious thoughts, ruminating thoughts, depressed mood   Mental Status Exam:  Appearance:   NA     Behavior:  Sharing, Rigid, Rationalizing, and Resistant  Motor:  NA  Speech/Language:   Clear and Coherent and Normal Rate  Affect:  NA  Mood:  depressed and irritable  Thought process:  normal  Thought content:    WNL  Sensory/Perceptual disturbances:    WNL  Orientation:  oriented to person, place, time/date, situation, and day of week  Attention:  Fair  Concentration:  Fair  Memory:  WNL  Fund of knowledge:   Fair  Insight:    Fair  Judgment:   Fair  Impulse Control:  Fair   Risk Assessment: Danger to Self:  No Self-injurious Behavior: No Danger to Others: No Duty to Warn:no Physical Aggression / Violence:No  Access to Firearms a concern: No  Gang Involvement:No   Subjective: Patient initially reported that she has been "good," noting a decrease in her son's colic. However, she continues to struggle with feelings of guilt regarding her daughter, who is having difficulty adjusting to the new sibling. As the session progressed, the patient acknowledged ongoing symptoms of anxiety and depression, describing them as mostly tolerable but still present. She reports current support from her partner, but shares significant emotional distress related to past experiences with her partner, expressing a sense of hopelessness during the session. The patient shared about her attending the psychiatry appointment and feels discouraged, expressing doubt  that either medication or therapy can undo the past. Despite these feelings, she remains willing to continue both psychiatry and therapy, seeking support and a space to talk.  Interventions: Psychoeducation, CBT, Polyvagal Informed   LCSW established psychological safety. LCSW met with patient to identify needs related to stressors and functioning, and assess and monitor for signs and symptoms of depression and anxiety, and assess safety. Checked in with patient regarding how they have been doing since the last follow-up session. LCSW assisted patient in processing their thoughts and emotions regarding multiple stressors, including adjustment to new baby and unresolved issues related to her partner. Provided support through active listening, validation of feelings, and highlighted patient's strengths. LCSW attempted to reframe unhelpful thoughts and discuss alternative coping skills; however, patient was resistant to exploring these strategies at this time.  Diagnosis:   ICD-10-CM   1. Major depressive disorder, recurrent episode, moderate (HCC)  F33.1     2. Generalized anxiety disorder  F41.1      Patient's goal of treatment is to learn more about regulating her emotions. She wants to work on getting over stuff that her partner put her through that has lead to trust issues. She wants to work on ruminating thoughts, being more confident, and shares she wants someone to talk to, because she doesn't really have anyone.   Plan: Patient will increase ability to regulate emotions and manage unhelpful thoughts by learning and practicing at least 3 new coping skills, as demonstrated through self-report and in-session use, over the next 8-10 weeks.  Through use of CBT techniques, DBT tools, mindfulness  practices, and polyvagal-informed, trauma-focused approaches, support the patient in identifying emotional triggers, building self-awareness, and developing effective regulation strategies such as grounding,  paced breathing, and self-soothing.   Future Appointments  Date Time Provider Department Center  03/09/2024  2:00 PM Ellender Palma, [place] [facility] None  03/16/2024  3:20 PM [facility] PROVIDER [facility] None   Palma Ellender, LCSW

## 2024-03-09 ENCOUNTER — Ambulatory Visit: Payer: Self-pay | Admitting: Licensed Clinical Social Worker

## 2024-03-09 DIAGNOSIS — F331 Major depressive disorder, recurrent, moderate: Secondary | ICD-10-CM

## 2024-03-09 DIAGNOSIS — F411 Generalized anxiety disorder: Secondary | ICD-10-CM | POA: Diagnosis not present

## 2024-03-09 NOTE — Progress Notes (Signed)
 Counselor/Therapist Progress Note  Patient ID: Eileen Dudley, MRN: [id number],    Date: 03/09/2024  Time Spent: 51 total minutes. I spent 46 minutes on real-time audio with the patient on the date of service. I spent an additional 5 minutes on pre- and post-visit activities on the date of service including collateral, chart review, team discussion, and documentation.   Treatment Type: Individual Therapy  Reported Symptoms: Mild mood improvement; Anxiety, anxious thoughts and worries; ruminating thoughts; low mood   Mental Status Exam:  Appearance:   NA     Behavior:  Appropriate, Sharing, and Motivated  Motor:  NA  Speech/Language:   Clear and Coherent and Normal Rate  Affect:  NA  Mood:  anxious and sad  Thought process:  normal  Thought content:    WNL  Sensory/Perceptual disturbances:    WNL  Orientation:  oriented to person, place, time/date, situation, and day of week  Attention:  Good  Concentration:  Good  Memory:  WNL  Fund of knowledge:   Good  Insight:    Fair  Judgment:   Fair  Impulse Control:  Fair   Risk Assessment: Danger to Self:  No Self-injurious Behavior: No Danger to Others: No Duty to Warn:no Physical Aggression / Violence:No  Access to Firearms a concern: No  Gang Involvement:No   Subjective: Patient reports that she is doing better and states it's better overall. She has successfully managed two days independently while her partner is at work. Patient is taking Seroquel at night, which she loves and reports is significantly helping her sleep. She notes she cannot take it during the day due to drowsiness. The patient describes her partner as very supportive; they are helping each other and she is getting more consistent sleep. She reports feeling a little easier emotionally and believes she is coping better. However, the patient continues to experience unresolved emotional pain related to past experiences with her  partner.  Interventions: Cognitive Behavioral Therapy and Client Centered, Trauma Informed  LCSW established psychological safety. LCSW met with patient to assess current needs related to stressors and overall functioning, including monitoring symptoms of anxiety and depression, and evaluating safety. Checked in on patient's status since the last session, discussing ongoing symptoms and progress. Provided a trauma-informed, supportive space characterized by safety, trust, and nonjudgmental presence, allowing the patient to feel empowered and validated while exploring and processing thoughts and emotions related to previous experiences with her partner. LCSW taught the patient to notice and name triggers and points of emotional activation, helping increase awareness of early signs of distress. paced the conversation to honor the patient's readiness and resilience, fostering collaboration and self-efficacy throughout the session.   Diagnosis:   ICD-10-CM   1. Major depressive disorder, recurrent episode, moderate (HCC)  F33.1     2. Generalized anxiety disorder  F41.1      Plan: Continue to provide space for patient to process thoughts and emotions related to unresolved issues with her partner.   Patient's goal of treatment is to learn more about regulating her emotions. She wants to work on getting over stuff that her partner put her through that has lead to trust issues. She wants to work on ruminating thoughts, being more confident, and shares she wants someone to talk to, because she doesn't really have anyone.    Long-Term Goal Over the next year, reduce anxiety symptoms and increase the patient's ability to tolerate uncertainty, regulate worry, and engage in valued actions despite fear, as evidenced by  patient self-report and/or GAD-7 scores.  Goals and Objectives 1. Goal: Increase awareness and regulation of anxious thoughts 07/01 progressing  Patient will identify and track at least 3  recurring anxiety-provoking thoughts or themes per week. Patient will reframe or defuse 2+ anxious thoughts per week using thought records or defusion strategies. Patient will identify 2-3 anxiety-maintaining beliefs (e.g., "If I let my guard down, something bad will happen") and explore alternatives.   2. Goal: Decrease avoidance and increase engagement in valued activities Patient will identify 3 avoided situations related to anxiety and develop a graded exposure hierarchy. Patient will face at least one avoided situation per week, increasing in difficulty over time. Patient will set and act on at least one value-based goal per week, even in the presence of anxiety.   3. Goal: Improve emotional and physiological self-regulation 07/01 progressing  Patient will identify personal anxiety cues and early warning signs across contexts (tracked weekly). Patient will use at least one regulation strategy (e.g., breathwork, grounding) daily for 4 weeks. Patient will reduce panic or high-anxiety episodes (e.g., from 5/week to 2/week within 8 weeks).   4. Goal: Build tolerance for uncertainty and reduce preemptive problem-solving Patient will identify 2-3 triggers for intolerance of uncertainty (IU) and associated behaviors. Patient will practice "willingness" skills weekly (e.g., accepting discomfort, delaying worry). Patient will reduce time spent on worry loops or reassurance-seeking by 50% within 8 weeks (self-monitored).   5. Goal: Enhance present-moment focus and reduce cognitive over-engagement Patient will engage in daily mindfulness practice (5-10 min/day) using breath, sound, or movement-based anchors. Patient will learn and apply at least 3 "anchor-to-present" techniques when overwhelmed by worry. Long-Term Goal: Reduce depressive symptoms and improve overall functioning and quality of life, as measured by depression screening tools and/or patient self-report.   Goals and Objectives 1.  Goal: Improve mood and increase daily functioning Patient will engage in at least one pleasurable or meaningful activity per day, 5 days/week for 4 weeks.   2. Goal: Develop greater emotional regulation and distress tolerance Patient will demonstrate use of at least two DBT distress tolerance skills during high emotional distress (measured via session self-report/journal). Patient will identify and label 4 or more emotions accurately in-the-moment (measured via emotion log/self-report).   3. Goal: Increase psychological flexibility and reduce cognitive fusion Patient will identify 3 recurring self-critical thoughts and practice defusion techniques with each (tracked weekly). Patient will describe values in at least 3 life domains (e.g., relationships, career, health) and commit to value-based actions.   4. Goal: Improve nervous system regulation and somatic awareness Patient will practice at least one regulation skill (e.g., vagal breathing, somatic tracking) daily for 4 weeks.   5. Goal: Strengthen mindful awareness and self-compassion Patient will complete at least 4 guided meditations per week (tracked via journal or self-report). Patient will practice self-compassion statements at least once per day.   6. Goal: Strengthen cognitive flexibility and reduce unhelpful thoughts Patient will identify and record at least 3 recurring cognitive distortions (e.g., catastrophizing, all-or-nothing thinking) weekly. Patient will reframe at least 2 automatic negative thoughts per week using thought records or similar tools. Patient will identify 2-3 negative core beliefs contributing to depressive symptoms and generate alternative balanced beliefs.  I Future Appointments  Date Time Provider Department Center  03/16/2024  3:20 PM [facility] PROVIDER [facility] None  03/16/2024  4:00 PM Ellender Palma, LCSW [facility] None   Palma Ellender, [place]

## 2024-03-11 ENCOUNTER — Telehealth: Payer: Self-pay | Admitting: Licensed Clinical Social Worker

## 2024-03-11 NOTE — Telephone Encounter (Signed)
 03/10/24 Patient sent email to LCSW wanting to cancel next week's appointment and shared she does not want to reschedule. LCSW responded requesting for a phone conversation, patient did not respond.

## 2024-03-16 ENCOUNTER — Ambulatory Visit: Admitting: Licensed Clinical Social Worker

## 2024-03-16 ENCOUNTER — Ambulatory Visit: Payer: Self-pay

## 2024-03-16 ENCOUNTER — Telehealth: Payer: Self-pay

## 2024-03-16 NOTE — Telephone Encounter (Signed)
 DNKA for post-partum 03/16/24. Call to client and left message requesting she call and reschedule missed appt. Number to call provided. Burnadette Lowers, RN

## 2024-03-17 ENCOUNTER — Other Ambulatory Visit (HOSPITAL_COMMUNITY): Payer: Self-pay

## 2024-03-25 NOTE — Telephone Encounter (Signed)
 Call to client to reschedule recently missed post-partum appt and per recorded message, not a working number. Call to emergency contact and left message requesting assistance contacting Eileen Dudley to call the [facility] to reschedule her post-partum appt. Number to call provided. Burnadette Lowers, RN

## 2024-03-30 NOTE — Telephone Encounter (Signed)
 Call to client in effort to schedule post-partum appt (kept appt 02/27/24 post-partum with Dr. Macario, but 3 week follow-up appt ordered which she did not keep). Per recorded message

## 2024-03-30 NOTE — Telephone Encounter (Signed)
 phone is not a working number. Call to emergency contact and client answered phone. When RN identified self, client stated her number had changed and could she please call nurse back in 5 minutes. RN replied yes. Burnadette Lowers, RN

## 2024-03-31 NOTE — Telephone Encounter (Signed)
 Call to client to schedule post-partum appt with IUD insertion. (Early post-partum appt with Dr. Macario few weeks ago who requested 3 week follow-up appt. Client DNKA as scheduled). Appt scheduled for 04/05/24. Client desires IUD and appt scheduled for 50 minutes. Due to medical / psychiatric history, appt scheduled when Dr. Macario provider. Burnadette Lowers, RN

## 2024-04-05 ENCOUNTER — Other Ambulatory Visit: Payer: Self-pay | Admitting: Family Medicine

## 2024-04-05 ENCOUNTER — Encounter: Payer: Self-pay | Admitting: Family Medicine

## 2024-04-05 ENCOUNTER — Ambulatory Visit: Admitting: Family Medicine

## 2024-04-05 DIAGNOSIS — O1495 Unspecified pre-eclampsia, complicating the puerperium: Secondary | ICD-10-CM

## 2024-04-05 DIAGNOSIS — Z30013 Encounter for initial prescription of injectable contraceptive: Secondary | ICD-10-CM

## 2024-04-05 DIAGNOSIS — D649 Anemia, unspecified: Secondary | ICD-10-CM | POA: Insufficient documentation

## 2024-04-05 DIAGNOSIS — Z87898 Personal history of other specified conditions: Secondary | ICD-10-CM

## 2024-04-05 DIAGNOSIS — Z3042 Encounter for surveillance of injectable contraceptive: Secondary | ICD-10-CM | POA: Insufficient documentation

## 2024-04-05 DIAGNOSIS — F32A Depression, unspecified: Secondary | ICD-10-CM

## 2024-04-05 LAB — PREGNANCY, URINE: Preg Test, Ur: NEGATIVE

## 2024-04-05 LAB — HEMOGLOBIN, FINGERSTICK: Hemoglobin: 10.6 g/dL — ABNORMAL LOW (ref 11.1–15.9)

## 2024-04-05 MED ORDER — IRON (FERROUS SULFATE) 325 (65 FE) MG PO TABS: 325.0000 mg | ORAL_TABLET | ORAL | 3 refills | Status: AC

## 2024-04-05 MED ORDER — MEDROXYPROGESTERONE ACETATE 150 MG/ML IM SUSP
150.0000 mg | INTRAMUSCULAR | Status: AC
  Administered 2024-04-05: 150 mg via INTRAMUSCULAR

## 2024-04-05 NOTE — Patient Instructions (Addendum)
 From [facility] psychiatrist Dr. Andrea Ano on 9/09:  -- Discontinue Seroquel nightly -- Start taking Trazodone 50mg -75mg  nightly for insomnia -- Start taking Abilify 2.5mg  every morning for one week, then increase to 5mg  every morning if tolerated -- Continue seeing psychotherapist weekly   Anemia Start iron  every other day.  Keep taking iron  until you get with your primary care doctor and they check it.   Birth control Depo provera  today. Urine pregnancy test today was negative.  Take a home pregnancy test in 2 weeks.  Come back in 4 weeks for another pregnancy test and your IUD.   Therapy You can use the following website to start looking for a therapist. Remember, finding a therapist you click with it a lot like speed dating - you have to sort through a bunch of lemons before finding what you like!  Alan Hail, LCSW Licensed Clinical Social Worker at [facility] Department  Phone: [phone] [address], [place] [address]  [email or web address] Access to care line (24/7) [phone] Treatment information  Crisis assistance  Mobile crisis team connection Mental health needs  Substance use disorder  Intellectual and developmental disabilities

## 2024-04-05 NOTE — Assessment & Plan Note (Signed)
 Doing better than at the 2 week PP exam. EPDS score 9 (improved), abuse screen and 5P negative. Is connected with therapy and psychiatry currently. Has upcoming appointment in October to establish care with a  PCP.

## 2024-04-05 NOTE — Assessment & Plan Note (Signed)
 Hgb 10.6 today, down from 10.9-11.4 during hospital admission. Not currently on PO iron . Reports poor eating habits during postpartum period given stressors of childcare. Discussed iron  rich foods, including leafy greens and red meats. Ferrous sulfate  325 mg rx sent to pharmacy for every other day dosing. Plan to have PCP follow.
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
## 2024-04-05 NOTE — Progress Notes (Signed)
 In house hgb and urine preg result reviewed during visit. Consuelo Kingsley RN

## 2024-04-05 NOTE — Assessment & Plan Note (Signed)
 BP normal today, patient reports not currently taking nifedipine . Given normotension and no sxs pre-eclampsia, safe to stop. Has appointment to establish with PCP in October.

## 2024-04-05 NOTE — Assessment & Plan Note (Signed)
 Improvement from 2 week mood check. Has been connected with LCSW Alan Hail and [facility] psychiatry for med management. Plan from [facility] psychiatrist Dr. Andrea Ano on 9/09:  -- Discontinue Seroquel nightly -- Start taking Trazodone 50mg -75mg  nightly for insomnia -- Start taking Abilify 2.5mg  every morning for one week, then increase to 5mg  every morning if tolerated -- Continue seeing psychotherapist weekly   Has not yet picked up new meds but will soon. Next therapy appointment tomorrow with Alan Hail, LCSW.

## 2024-04-05 NOTE — Assessment & Plan Note (Signed)
 Desires IUD for contraception, but I cannot be reasonably certain she is not pregnant at this time to due sexual activity. Urine pregnancy test today is negative. Discussed Plan B vs depo provera . Eileen Dudley would like to proceed with Depo Provera  today. Plan for home pregnancy test in 2 weeks. Will return to [facility] in 4 weeks for pregnancy test and IUD placement if UPT negative.

## 2024-04-05 NOTE — Assessment & Plan Note (Signed)
 Reports binge drinking with partner on rare occasion both children are asleep. Drinks 4-6 hard seltzers per episode. Counseled to not drink concurrently with new medications of trazodone and Abilify - especially trazodone because of sedative effects.

## 2024-04-06 ENCOUNTER — Ambulatory Visit: Payer: Self-pay | Admitting: Family Medicine

## 2024-04-06 NOTE — Progress Notes (Signed)
 Discussed in clinic with patient.   Dorothyann Helling, MD 04/06/24  12:51 PM

## 2024-04-07 ENCOUNTER — Ambulatory Visit: Admitting: Licensed Clinical Social Worker

## 2024-04-07 DIAGNOSIS — F411 Generalized anxiety disorder: Secondary | ICD-10-CM

## 2024-04-07 DIAGNOSIS — F331 Major depressive disorder, recurrent, moderate: Secondary | ICD-10-CM

## 2024-04-07 NOTE — Progress Notes (Signed)
 Counselor/Therapist Progress Note  Patient ID: Eileen Dudley, MRN: [id number],    Date: 04/07/2024  Time Spent: 50 total minutes. I spent 45 minutes on real-time audio and video with the patient on the date of service. I spent an additional 5  minutes on pre- and post-visit activities on the date of service including collateral, chart review, team discussion, and documentation.   Treatment Type: Individual Therapy  Reported Symptoms: Low mood, sadness, ruminating thoughts; Anxiety, anxious thoughts, over whelm  Mental Status Exam:  Appearance:   Casual     Behavior:  Appropriate, Sharing, and Motivated  Motor:  Normal  Speech/Language:   Clear and Coherent and Normal Rate  Affect:  Appropriate, Congruent, and Full Range  Mood:  normal  Thought process:  normal  Thought content:    WNL  Sensory/Perceptual disturbances:    WNL  Orientation:  oriented to person, place, time/date, situation, and day of week  Attention:  Good  Concentration:  Good  Memory:  WNL  Fund of knowledge:   Good  Insight:    Fair  Judgment:   Fair  Impulse Control:  Fair   Risk Assessment: Danger to Self:  No Self-injurious Behavior: No Danger to Others: No Duty to Warn:no Physical Aggression / Violence:No  Access to Firearms a concern: No  Gang Involvement:No   Subjective: Patient reports her mood has been kinda shit, which she attributes to the recent CPS report and related events. She described feeling embarrassed, sad, and upset with herself for her actions. She expressed feeling overwhelmed and unsure of what could help, yet remained engaged and cooperative throughout the session, openly sharing her thoughts and feelings. Patient states that she has using tapping as a coping skill.She reports continued motivation to engage in treatment.  Medication changes: Started trazodone. Abilify prescription not yet filled.  Interventions: Cognitive Behavioral Therapy and ACT, Client Centered  LCSW  established psychological safety and rapport. Met with patient to assess current functioning, needs related to psychosocial stressors, and monitor for signs and symptoms of anxiety and depressed mood. Safety was assessed. LCSW checked in on how the patient has been doing since the previous session. LCSW assisted the patient in processing thoughts and emotions related to current stressors, including the recent CPS involvement, difficulties with self-confidence, and challenges following through on tasks. Treatment plan was reviewed.Using ACT, LCSW explored the patient's personal values and supported her in identifying small, actionable steps to align her behavior with those values. The patient collaboratively developed a plan to: Incorporate vegetables into dinner, Increase physical activity (e.g., move more), and Attend a local mom's group for social support. LCSW and patient also discussed potential barriers to following through with these goals. Together, they explored how anxiety may present in her body and mind, and practiced noticing anxious thoughts and sensations as symptoms of anxiety--rather than facts--to reduce avoidance and increase mindful awareness.  Diagnosis:   ICD-10-CM   1. Major depressive disorder, recurrent episode, moderate (HCC)  F33.1     2. Generalized anxiety disorder  F41.1      Plan: Continue to provide space for patient to process thoughts and emotions related to unresolved issues with her partner.    Patient's goal of treatment is to learn more about regulating her emotions. She wants to work on getting over stuff that her partner put her through that has lead to trust issues. She wants to work on ruminating thoughts, being more confident, and shares she wants someone to talk to, because she  doesn't really have anyone.    Long-Term Goal Over the next year, reduce anxiety symptoms and increase the patient's ability to tolerate uncertainty, regulate worry, and engage in  valued actions despite fear, as evidenced by patient self-report and/or GAD-7 scores.   Goals and Objectives 1. Goal: Increase awareness and regulation of anxious thoughts 07/01 progressing  Patient will identify and track at least 3 recurring anxiety-provoking thoughts or themes per week. Patient will reframe or defuse 2+ anxious thoughts per week using thought records or defusion strategies. Patient will identify 2-3 anxiety-maintaining beliefs (e.g., "If I let my guard down, something bad will happen") and explore alternatives.   2. Goal: Decrease avoidance and increase engagement in valued activities Patient will identify 3 avoided situations related to anxiety and develop a graded exposure hierarchy. Patient will face at least one avoided situation per week, increasing in difficulty over time. Patient will set and act on at least one value-based goal per week, even in the presence of anxiety.   3. Goal: Improve emotional and physiological self-regulation 07/01 progressing  Patient will identify personal anxiety cues and early warning signs across contexts (tracked weekly). Patient will use at least one regulation strategy (e.g., breathwork, grounding) daily for 4 weeks. Patient will reduce panic or high-anxiety episodes (e.g., from 5/week to 2/week within 8 weeks).   4. Goal: Build tolerance for uncertainty and reduce preemptive problem-solving Patient will identify 2-3 triggers for intolerance of uncertainty (IU) and associated behaviors. Patient will practice "willingness" skills weekly (e.g., accepting discomfort, delaying worry). Patient will reduce time spent on worry loops or reassurance-seeking by 50% within 8 weeks (self-monitored).   5. Goal: Enhance present-moment focus and reduce cognitive over-engagement Patient will engage in daily mindfulness practice (5-10 min/day) using breath, sound, or movement-based anchors. Patient will learn and apply at least 3  "anchor-to-present" techniques when overwhelmed by worry. Long-Term Goal: Reduce depressive symptoms and improve overall functioning and quality of life, as measured by depression screening tools and/or patient self-report.   Goals and Objectives 1. Goal: Improve mood and increase daily functioning Patient will engage in at least one pleasurable or meaningful activity per day  2. Goal: Develop greater emotional regulation and distress tolerance Patient will demonstrate use of at least two DBT distress tolerance skills during high emotional distress (measured via session self-report/journal). Patient will identify and label 4 or more emotions accurately in-the-moment (measured via emotion log/self-report).   3. Goal: Increase psychological flexibility and reduce cognitive fusion Patient will identify 3 recurring self-critical thoughts and practice defusion techniques with each (tracked weekly). Patient will describe values in at least 3 life domains (e.g., relationships, career, health) and commit to value-based actions.   4. Goal: Improve nervous system regulation and somatic awareness Patient will practice at least one regulation skill (e.g., vagal breathing, somatic tracking) daily.    5. Goal: Strengthen mindful awareness and self-compassion Patient will complete at least 4 guided meditations per week (tracked via journal or self-report). Patient will practice self-compassion statements at least once per day.   6. Goal: Strengthen cognitive flexibility and reduce unhelpful thoughts Patient will identify and record at least 3 recurring cognitive distortions (e.g., catastrophizing, all-or-nothing thinking) weekly. Patient will reframe at least 2 automatic negative thoughts per week using thought records or similar tools. Patient will identify 2-3 negative core beliefs contributing to depressive symptoms and generate alternative balanced beliefs.  Future Appointments  Date Time Provider  Department Center  04/13/2024  4:00 PM Ellender Palma, [place] [facility] None  Alan Hail, LCSW

## 2024-04-13 ENCOUNTER — Ambulatory Visit: Admitting: Licensed Clinical Social Worker

## 2024-04-23 NOTE — Addendum Note (Signed)
 Addended by: Lashe Oliveira on: 04/23/2024 12:09 PM   Modules accepted: Orders

## 2024-06-22 ENCOUNTER — Other Ambulatory Visit: Payer: Self-pay

## 2024-06-22 ENCOUNTER — Other Ambulatory Visit (HOSPITAL_COMMUNITY): Payer: Self-pay

## 2024-07-21 ENCOUNTER — Other Ambulatory Visit: Payer: Self-pay
